# Patient Record
Sex: Male | Born: 1985 | Race: Black or African American | Hispanic: No | Marital: Single | Smoking: Current some day smoker
Health system: Southern US, Community
[De-identification: ages and names within clinical notes are randomized; demographics above are authoritative.]

## PROBLEM LIST (undated history)

## (undated) DIAGNOSIS — I10 Essential (primary) hypertension: Secondary | ICD-10-CM

## (undated) DIAGNOSIS — E785 Hyperlipidemia, unspecified: Secondary | ICD-10-CM

## (undated) HISTORY — PX: NECK SURGERY: SHX720

## (undated) HISTORY — DX: Essential (primary) hypertension: I10

## (undated) HISTORY — DX: Hyperlipidemia, unspecified: E78.5

---
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
## 2005-07-14 ENCOUNTER — Emergency Department (HOSPITAL_COMMUNITY): Admission: EM | Admit: 2005-07-14 | Discharge: 2005-07-14 | Payer: Self-pay | Admitting: *Deleted

## 2006-11-06 ENCOUNTER — Emergency Department (HOSPITAL_COMMUNITY): Admission: EM | Admit: 2006-11-06 | Discharge: 2006-11-06 | Payer: Self-pay | Admitting: Emergency Medicine

## 2007-06-08 ENCOUNTER — Emergency Department (HOSPITAL_COMMUNITY): Admission: EM | Admit: 2007-06-08 | Discharge: 2007-06-08 | Payer: Self-pay | Admitting: Emergency Medicine

## 2009-04-11 ENCOUNTER — Emergency Department: Payer: Self-pay | Admitting: Emergency Medicine

## 2011-02-25 ENCOUNTER — Emergency Department (HOSPITAL_COMMUNITY)
Admission: EM | Admit: 2011-02-25 | Discharge: 2011-02-26 | Disposition: A | Discharge status: Home / Self Care | Payer: Self-pay | Attending: Emergency Medicine | Admitting: Emergency Medicine

## 2011-02-25 DIAGNOSIS — R070 Pain in throat: Secondary | ICD-10-CM | POA: Insufficient documentation

## 2011-02-25 DIAGNOSIS — IMO0001 Reserved for inherently not codable concepts without codable children: Secondary | ICD-10-CM | POA: Insufficient documentation

## 2011-02-25 DIAGNOSIS — R05 Cough: Secondary | ICD-10-CM | POA: Insufficient documentation

## 2011-02-25 DIAGNOSIS — J3489 Other specified disorders of nose and nasal sinuses: Secondary | ICD-10-CM | POA: Insufficient documentation

## 2011-02-25 DIAGNOSIS — R6889 Other general symptoms and signs: Secondary | ICD-10-CM | POA: Insufficient documentation

## 2011-02-25 DIAGNOSIS — R059 Cough, unspecified: Secondary | ICD-10-CM | POA: Insufficient documentation

## 2011-02-25 DIAGNOSIS — R220 Localized swelling, mass and lump, head: Secondary | ICD-10-CM | POA: Insufficient documentation

## 2011-02-25 DIAGNOSIS — J069 Acute upper respiratory infection, unspecified: Secondary | ICD-10-CM | POA: Insufficient documentation

## 2015-04-26 ENCOUNTER — Emergency Department (HOSPITAL_COMMUNITY): Payer: 59

## 2015-04-26 ENCOUNTER — Encounter (HOSPITAL_COMMUNITY): Payer: Self-pay | Admitting: Emergency Medicine

## 2015-04-26 ENCOUNTER — Emergency Department (HOSPITAL_COMMUNITY)
Admission: EM | Admit: 2015-04-26 | Discharge: 2015-04-26 | Disposition: A | Discharge status: Home / Self Care | Payer: 59 | Attending: Emergency Medicine | Admitting: Emergency Medicine

## 2015-04-26 DIAGNOSIS — M25572 Pain in left ankle and joints of left foot: Secondary | ICD-10-CM | POA: Diagnosis not present

## 2015-04-26 MED ORDER — ACETAMINOPHEN 500 MG PO TABS: 500.0000 mg | ORAL_TABLET | Freq: Four times a day (QID) | ORAL | Status: DC | PRN

## 2015-04-26 MED ORDER — IBUPROFEN 800 MG PO TABS: 800.0000 mg | ORAL_TABLET | Freq: Three times a day (TID) | ORAL | Status: DC

## 2015-04-26 MED ORDER — IBUPROFEN 800 MG PO TABS
800.0000 mg | ORAL_TABLET | Freq: Once | ORAL | Status: AC
  Administered 2015-04-26: 800 mg via ORAL
  Filled 2015-04-26: qty 1

## 2015-04-26 NOTE — ED Notes (Addendum)
Per EMS, woke up around 1000 with left ankle pain. Denies any PMH. States he's been crawling around his apartment since he woke up, per EMS no deformities or injuries, only mild swelling.   Redness observed to top of right foot where patient is complaining of pain. No deformities, states he went out last night but does not remember injuring foot.

## 2015-04-26 NOTE — ED Provider Notes (Signed)
CSN: [id number]     Arrival date & time 04/26/15  1256 History  By signing my name below, I, Christopher Maynard, attest that this documentation has been prepared under the direction and in the presence of Christopher FowlerKayla Quinn Quam, PA-C.  Electronically Signed: [name], ED Scribe. 04/26/2015. 2:26 PM.    Chief Complaint  Patient presents with  . Foot Pain      Patient is a 29 y.o. male presenting with lower extremity pain. The history is provided by the patient. No language interpreter was used.  Foot Pain   HPI Comments: Margorie JohnBrian N Maynard is a 29 y.o. male who presents to the Emergency Department complaining of constant, moderate left ankle pain that has been present since this morning. Pt reports he woke up today with ankle pain after drinking alcohol last night, and reports mild swelling in the area. Pt denies any known injury, and reports it is painful to ambulate. Pt denies numbness or tingling, knee pain, leg swelling, fever. Pt denies taking any medication or doing anything to treat his pain.    History reviewed. No pertinent past medical history. History reviewed. No pertinent past surgical history. History reviewed. No pertinent family history. Social History  Substance Use Topics  . Smoking status: None  . Smokeless tobacco: None  . Alcohol Use: None    Review of Systems  A complete 10 system review of systems was obtained and all systems are negative except as noted in the HPI and PMH.    Allergies  Review of patient's allergies indicates no known allergies.  Home Medications   Prior to Admission medications   Medication Sig Start Date End Date Taking? Authorizing Provider  acetaminophen (TYLENOL) 500 MG tablet Take 1 tablet (500 mg total) by mouth every 6 (six) hours as needed. 04/26/15   Christopher FowlerKayla Nylia Gavina, PA-C  ibuprofen (ADVIL,MOTRIN) 800 MG tablet Take 1 tablet (800 mg total) by mouth 3 (three) times daily. 04/26/15   Christopher Arterberry, PA-C   BP 134/99 mmHg  Pulse 87  Temp(Src) 98.3  F (36.8 C) (Oral)  Resp 18  SpO2 99% Physical Exam  Constitutional: He is oriented to person, place, and time. He appears well-developed and well-nourished.  HENT:  Head: Normocephalic and atraumatic.  Eyes: Conjunctivae are normal. No scleral icterus.  Neck: No tracheal deviation present.  Cardiovascular: Normal rate and intact distal pulses.   Pulses:      Dorsalis pedis pulses are 2+ on the right side, and 2+ on the left side.       Posterior tibial pulses are 2+ on the right side, and 2+ on the left side.  Pulmonary/Chest: Effort normal. No respiratory distress.  Abdominal: He exhibits no distension.  Musculoskeletal: Normal range of motion. He exhibits tenderness.       Right ankle: Normal.       Left ankle: He exhibits normal range of motion, no swelling, no ecchymosis, no deformity, no laceration and normal pulse. Tenderness. Lateral malleolus, medial malleolus, AITFL and CF ligament tenderness found. No posterior TFL and no proximal fibula tenderness found. Achilles tendon normal.       Right lower leg: Normal.       Left lower leg: Normal. He exhibits no swelling.  Neurological: He is alert and oriented to person, place, and time.  Strength and sensation intact bilaterally throughout lower extremities.  Skin: Skin is warm and dry. No abrasion, no bruising, no ecchymosis, no laceration and no rash noted. No erythema.  Psychiatric: He has a  normal mood and affect. His behavior is normal.  Nursing note and vitals reviewed.   ED Course  Procedures (including critical care time)  DIAGNOSTIC STUDIES: Oxygen Saturation is 99% on room air, normal by my interpretation.    COORDINATION OF CARE: 2:14 PM Discussed treatment plan with pt at bedside and pt agreed to plan.   Labs Review Labs Reviewed - No data to display  Imaging Review Dg Ankle Complete Left  04/26/2015  CLINICAL DATA:  Pain.  No known injury EXAM: LEFT ANKLE COMPLETE - 3+ VIEW COMPARISON:  None. FINDINGS:  Frontal, oblique, and lateral views were obtained. There is no demonstrable fracture or joint effusion. The ankle mortise appears intact. There is a benign exostosis arising from the dorsal distal talus. Small unfused apophysis along the dorsal proximal navicular. IMPRESSION: Benign exostosis arising from the dorsal distal talus. No fracture or appreciable arthropathy. Mortise intact. Electronically Signed   By: Bretta Bang III M.D.   On: 04/26/2015 14:43   Dg Foot Complete Left  04/26/2015  CLINICAL DATA:  Pain.  No known trauma EXAM: LEFT FOOT - COMPLETE 3+ VIEW COMPARISON:  None. FINDINGS: Frontal, oblique, and lateral views were obtained. There is no demonstrable fracture or dislocation. Joint spaces appear intact. There is a small spur arising from the dorsal proximal navicular. No erosive change. There is a benign exostosis arising from the dorsal distal talus. IMPRESSION: No demonstrable fracture or dislocation. No appreciable joint space narrowing. Small spur arising from the dorsal proximal navicular. Small benign exostosis dorsal distal talus. Electronically Signed   By: Bretta Bang III M.D.   On: 04/26/2015 14:42   I have personally reviewed and evaluated these images and lab results as part of my medical decision-making.   EKG Interpretation None      MDM   Final diagnoses:  Left ankle pain    Patient presents with left ankle and foot pain since this morning.  No known injury.  No meds tried PTA.  VSS, NAD.  On exam, left ankle is TTP along with left foot.  No swelling noted.  Strength and sensation intact.  DP pulses intact.  No signs of trauma.  Plain films negative for fx or dislocation.  Benign exostosis arising from dorsal distal talus.  Will apply ace wrap and give motrin. Evaluation does not show pathology requring ongoing emergent intervention or admission. Pt is hemodynamically stable and mentating appropriately. Discussed findings/results and plan with  patient/guardian, who agrees with plan. All questions answered. Return precautions discussed and outpatient follow up given.    I personally performed the services described in this documentation, which was scribed in my presence. The recorded information has been reviewed and is accurate.    Christopher Fowler, PA-C 04/26/15 1510  Alvira Monday, MD 04/27/15 1212

## 2015-04-26 NOTE — Discharge Instructions (Signed)
Ankle Pain Ankle pain is a common symptom. The bones, cartilage, tendons, and muscles of the ankle joint perform a lot of work each day. The ankle joint holds your body weight and allows you to move around. Ankle pain can occur on either side or back of 1 or both ankles. Ankle pain may be sharp and burning or dull and aching. There may be tenderness, stiffness, redness, or warmth around the ankle. The pain occurs more often when a person walks or puts pressure on the ankle. CAUSES  There are many reasons ankle pain can develop. It is important to work with your caregiver to identify the cause since many conditions can impact the bones, cartilage, muscles, and tendons. Causes for ankle pain include:  Injury, including a break (fracture), sprain, or strain often due to a fall, sports, or a high-impact activity.  Swelling (inflammation) of a tendon (tendonitis).  Achilles tendon rupture.  Ankle instability after repeated sprains and strains.  Poor foot alignment.  Pressure on a nerve (tarsal tunnel syndrome).  Arthritis in the ankle or the lining of the ankle.  Crystal formation in the ankle (gout or pseudogout). DIAGNOSIS  A diagnosis is based on your medical history, your symptoms, results of your physical exam, and results of diagnostic tests. Diagnostic tests may include X-ray exams or a computerized magnetic scan (magnetic resonance imaging, MRI). TREATMENT  Treatment will depend on the cause of your ankle pain and may include:  Keeping pressure off the ankle and limiting activities.  Using crutches or other walking support (a cane or brace).  Using rest, ice, compression, and elevation.  Participating in physical therapy or home exercises.  Wearing shoe inserts or special shoes.  Losing weight.  Taking medications to reduce pain or swelling or receiving an injection.  Undergoing surgery. HOME CARE INSTRUCTIONS   Only take over-the-counter or prescription medicines for  pain, discomfort, or fever as directed by your caregiver.  Put ice on the injured area.  Put ice in a plastic bag.  Place a towel between your skin and the bag.  Leave the ice on for 15-20 minutes at a time, 03-04 times a day.  Keep your leg raised (elevated) when possible to lessen swelling.  Avoid activities that cause ankle pain.  Follow specific exercises as directed by your caregiver.  Record how often you have ankle pain, the location of the pain, and what it feels like. This information may be helpful to you and your caregiver.  Ask your caregiver about returning to work or sports and whether you should drive.  Follow up with your caregiver for further examination, therapy, or testing as directed. SEEK MEDICAL CARE IF:   Pain or swelling continues or worsens beyond 1 week.  You have an oral temperature above 102 F (38.9 C).  You are feeling unwell or have chills.  You are having an increasingly difficult time with walking.  You have loss of sensation or other new symptoms.  You have questions or concerns. MAKE SURE YOU:   Understand these instructions.  Will watch your condition.  Will get help right away if you are not doing well or get worse.   This information is not intended to replace advice given to you by your health care provider. Make sure you discuss any questions you have with your health care provider.   Document Released: 10/22/2009 Document Revised: 07/27/2011 Document Reviewed: 12/04/2014 Elsevier Interactive Patient Education 2016 [employer].  Emergency Department Resource Guide 1) Find a Doctor  and Pay Out of Pocket Although you won't have to find out who is covered by your insurance plan, it is a good idea to ask around and get recommendations. You will then need to call the office and see if the doctor you have chosen will accept you as a new patient and what types of options they offer for patients who are self-pay. Some doctors offer  discounts or will set up payment plans for their patients who do not have insurance, but you will need to ask so you aren't surprised when you get to your appointment.  2) Contact Your Local Health Department Not all health departments have doctors that can see patients for sick visits, but many do, so it is worth a call to see if yours does. If you don't know where your local health department is, you can check in your phone book. The CDC also has a tool to help you locate your state's health department, and many state websites also have listings of all of their local health departments.  3) Find a Walk-in Clinic If your illness is not likely to be very severe or complicated, you may want to try a walk in clinic. These are popping up all over the country in pharmacies, drugstores, and shopping centers. They're usually staffed by nurse practitioners or physician assistants that have been trained to treat common illnesses and complaints. They're usually fairly quick and inexpensive. However, if you have serious medical issues or chronic medical problems, these are probably not your best option.  No Primary Care Doctor: - Call Health Connect at  [phone] - they can help you locate a primary care doctor that  accepts your insurance, provides certain services, etc. - Physician Referral Service- [phone]  Chronic Pain Problems: Organization         Address  Phone   Notes  [facility]  [phone] Patients need to be referred by their primary care doctor.   Medication Assistance: Organization         Address  Phone   Notes  [facility] Medication [facility] [address] [place] [place], [place] [address] [phone] --Must be a resident of [facility] -- Must have NO insurance coverage whatsoever (no Medicaid/ Medicare, etc.) -- The pt. MUST have a primary care doctor that directs their care regularly and follows them in the community   [facility]ist   [phone]   [employer]  [phone]    Agencies that provide inexpensive medical care: Organization         Address  Phone   Notes  [facility]  [phone]   [facility]    [phone]   [facility] [address] [place], [place] [address] [phone]   [facility] [address] [place], [place] [phone]   Planned Parenthood    [phone]   [facility]    [phone]   [facility]  [address], [place] Phone:  [phone], Fax:  [phone] Hours of Operation:  9 am - 6 pm, M-F.  Also accepts Medicaid/Medicare and self-pay.  [facility]  [address], [place] Phone: [phone], Fax: [phone]. Hours of Operation:  8:30 am - 5:30 pm, M-F.  Also accepts Medicaid and self-pay.  [facility] [place] [address] [employer] Phone: [phone]  [facility] [address], [place], [place] [phone], Ext. [phone] Mondays & Thursdays: 7-9 AM.  First 15 patients are seen on a first come, first serve basis.    Medicaid-accepting [facility] Providers:  Organization         Address  Phone   Notes  [facility] [address], [place] [phone] Also accepts self-pay patients.  [facility] [address], [place]  [phone]) [phone]   [facility] [address], [place] [phone]   [facility] [address] [place] [address], [place] [phone]   Renaye RakersVeita Bland [address], [place]   [phone] Only accepts [place] Access [place]icaid patients after they have their name applied to their card.   Self-Pay (no insurance) in [facility] [place]:  Organization         Address  Phone   Notes  Sickle Cell Patients, [facility] [address], [place] [phone]   [facility] [address], [place] [phone]   [facility]  [address],  [phone]) [phone]   [facility]/Dr. Osei-Bonsu  [address] [place] [address], [place] or [phone], [place] [phone] Phone number for both [place] and [place] locations is the same.  [facility] [address], [place] [phone]   [facility] [place] [address] [place] [address], [place] or [address] [phone]) [phone]) [phone]   [facility] [address], [place] [phone], phone; [phone], fax Sees patients 1st and 3rd Saturday of every month.  Must not qualify for public or private insurance (i.e. Medicaid, Medicare, [place] Health Choice, Veterans' Benefits)  Household income should be no more than 200% of the poverty level The clinic cannot treat you if you are pregnant or think you are pregnant  Sexually transmitted diseases are not treated at the clinic.    Dental Care: Organization         Address  Phone  Notes  [facility] of [facility] [address], [place] [phone] Accepts children up to age 621 who are enrolled in [place]icaid or [place] Health Choice; pregnant women with a Medicaid card; and children who have applied for Medicaid or [place] Health Choice, but were declined, whose parents can pay a reduced fee at time of service.  [facility] of [facility] [place]  [address], [place] [phone]) [phone] Accepts children up to age 29 who are enrolled in [place] or [place] Health Choice; pregnant women with a Medicaid card; and children who have applied for Medicaid or [place] Health Choice, but were declined, whose parents can pay a reduced fee at time of service.  [facility]  [address], [place]  [phone] Patients are seen by appointment only. Walk-ins are not accepted. [facility] will see patients 29 years of age and older. Monday - Tuesday (8am-5pm) Most Wednesdays (8:30-5pm) $30 per visit, cash only  [facility]  [address], [facility] [phone] Patients are seen by appointment only. Walk-ins are not accepted. [facility] will see patients 29 years of age and older. One Wednesday Evening (Monthly: Volunteer Based).  $30 per visit,  cash only  [employer] [facility]  [phone] for adults; Children under age 864, call Graduate Pediatric Dentistry at [phone]. Children aged 404-14, please call [phone]) [phone] to request a pediatric application.  Dental services are provided in all areas of dental care including fillings, crowns and bridges, complete and partial dentures, implants, gum treatment, root canals, and extractions. Preventive care is also provided. Treatment is provided to both adults and children. Patients are selected via a lottery and there is often a waiting list.   [facility] [address], [place]  [phone] [email or web address]   [facility] [address], [place], [place] [phone], Ext. [phone] Second and Fourth Thursday of each month, opens at 6:30 AM; Clinic ends at 9 AM.  Patients are seen on a first-come first-served basis, and a limited number are seen during each clinic.   [facility]  [address], [place], [place] [phone]   Eligibility Requirements You must have lived in [place], [place], or [place] counties for at least the last three months.   You cannot be eligible for state or federal sponsored National Cityhealthcare insurance, including CIGNAVeterans Administration, [place]icaid, or [employer]icare.   You generally cannot be eligible for healthcare insurance through your employer.    How to apply: Eligibility screenings are held every Tuesday and Wednesday  afternoon from 1:00 pm until 4:00 pm. You do not need an appointment for the interview!  [facility] [address], [place], [place] [phone]   [facility]  [phone]   [facility]  [phone]   [facility]  [phone]    Behavioral Health Resources in the Community: Intensive Outpatient Programs Organization         Address  Phone  Notes  [facility] [address], [place], [place] [phone]   [facility] [address], [place], [place] [phone]   [facility] [address], [place], [place]  [phone]   [facility] [address],  [place], [place] [phone] or [phone]   Substance Abuse Resources Organization         Address  Phone  Notes  [facility]  [phone]   [facility]  [phone]   [facility]  [phone]   [facility]  [phone]   Residential & Outpatient Substance Abuse Program  [phone]   Psychological Services Organization         Address  Phone  Notes  [facility]  [phone]- [phone]   [facility]  [phone]- [phone]   [facility] [address], [place] [phone] or [phone]    Mobile Crisis Teams Organization         Address  Phone  Notes  [facility], Mobile Crisis Care Unit  [phone]   [facility]  [address] [place], [place] [phone]   Doristine LocksSharon DeEsch [address] [place] [phone]    Self-Help/Support Groups Organization         Address  Phone             Notes  [facility] [place] - variety of support groups  336- [phone] Call for more information  Narcotics Anonymous (NA), [facility] [address], [place]  2 meetings at this location   Nutritional therapistesidential Treatment  Programs Organization  Address  Phone  Notes  °[facility] [address],    °[place]  [phone]   °[facility] ° [address], [place], [place] [phone]   °[facility] [address], [place] [phone] Admissions: 8am-3pm M-F  °[facility] [address]    °[place], [place] [phone]   °[facility] [address], [place], [place] [phone]   °[facility] [address]  °[place], [place] [phone]   °[facility] - Intensive Outpatient [address], [place], [place] [phone]   °[facility] ([facility] [address]  °[place], [place] 1-877-615-2722 or [phone]   °[facility] ([facility]) [address] [place], [place] [phone] Accepts Medicaid  °[facility] [address]  °[place] 1-800-659-3381 Substance Abuse/Addiction Treatment  ° °[place] Behavioral Health Resources °Organization         Address  Phone  Notes  °[facility]  (888) [phone]   °Julie Brannon, PhD [address] [place], [place]   ([phone]) [phone] or (336) 951-0000   °[facility]   [address] °[place], [place] (336) [phone]   °[facility] [address], [place], [place] (336) [phone] Insurance/Medicaid/sponsorship through [facility]  °[facility] [address]                                    [place], [place] ([phone]) [phone] Therapy/tele-psych/case  °[facility] [address]  ° [place],  ([phone]) [phone]    °Dr. Arfeen  ([phone]) [phone]   °[facility] [place]  [employer] [place] [facility] 1) [address], [place] °2) [address], [place] °3)  [address], [place] (336) [phone] °(336) [phone] ° °(336) [phone]   °[place] Child Abuse Hotline (336) 342-1394 or (336) [phone] (After Hours)    ° ° ° °

## 2017-01-31 ENCOUNTER — Emergency Department (HOSPITAL_COMMUNITY): Payer: 59

## 2017-01-31 ENCOUNTER — Encounter (HOSPITAL_COMMUNITY): Payer: Self-pay | Admitting: Emergency Medicine

## 2017-01-31 ENCOUNTER — Emergency Department (HOSPITAL_COMMUNITY)
Admission: EM | Admit: 2017-01-31 | Discharge: 2017-01-31 | Disposition: A | Discharge status: Home / Self Care | Payer: 59 | Attending: Emergency Medicine | Admitting: Emergency Medicine

## 2017-01-31 DIAGNOSIS — M25442 Effusion, left hand: Secondary | ICD-10-CM | POA: Insufficient documentation

## 2017-01-31 DIAGNOSIS — F17200 Nicotine dependence, unspecified, uncomplicated: Secondary | ICD-10-CM | POA: Insufficient documentation

## 2017-01-31 MED ORDER — MELOXICAM 15 MG PO TABS: 15.0000 mg | ORAL_TABLET | Freq: Every day | ORAL | 0 refills | Status: DC

## 2017-01-31 MED ORDER — NAPROXEN 500 MG PO TABS
500.0000 mg | ORAL_TABLET | Freq: Once | ORAL | Status: AC
  Administered 2017-01-31: 500 mg via ORAL
  Filled 2017-01-31: qty 1

## 2017-01-31 MED ORDER — OXYCODONE-ACETAMINOPHEN 5-325 MG PO TABS
1.0000 | ORAL_TABLET | ORAL | Status: DC | PRN
  Administered 2017-01-31: 1 via ORAL
  Filled 2017-01-31: qty 1

## 2017-01-31 NOTE — ED Triage Notes (Signed)
Patient states that two weeks ago his finger started swelling. His Left finger has had no injury to it.

## 2017-01-31 NOTE — ED Provider Notes (Addendum)
[facility]-EMERGENCY DEPT Provider Note   CSN: [id number] Arrival date & time: 01/31/17  0010     History   Chief Complaint Chief Complaint  Patient presents with  . Joint Swelling    HPI [facility] is a 31 y.o. male.  The history is provided by the patient.  Hand Pain  This is a chronic problem. The current episode started more than 1 week ago (at least 2 weeks ). The problem occurs constantly. The problem has not changed since onset.Pertinent negatives include no chest pain, no abdominal pain, no headaches and no shortness of breath. Nothing aggravates the symptoms. Nothing relieves the symptoms. He has tried nothing for the symptoms. The treatment provided no relief.  Left middle finger with PIP swelling.  No warmth, or erythema. Denies trauma.  Was reportedly seen by outside urgent care and told it was likely arthritis and prescribed meloxicam which he did not take.    History reviewed. No pertinent past medical history.  There are no active problems to display for this patient.   History reviewed. No pertinent surgical history.     Home Medications    Prior to Admission medications   Medication Sig Start Date End Date Taking? Authorizing Provider  acetaminophen (TYLENOL) 500 MG tablet Take 1 tablet (500 mg total) by mouth every 6 (six) hours as needed. Patient taking differently: Take 500 mg by mouth every 6 (six) hours as needed for mild pain or moderate pain.  04/26/15  Yes Cheri Fowler, PA-C  ibuprofen (ADVIL,MOTRIN) 800 MG tablet Take 1 tablet (800 mg total) by mouth 3 (three) times daily. Patient not taking: Reported on 01/31/2017 04/26/15   Cheri Fowler, PA-C    Family History History reviewed. No pertinent family history.  Social History Social History  Substance Use Topics  . Smoking status: Current Every Day Smoker  . Smokeless tobacco: Never Used  . Alcohol use Yes     Allergies   Patient has no known allergies.   Review of Systems Review of  Systems  Constitutional: Negative for fever.  Respiratory: Negative for shortness of breath.   Cardiovascular: Negative for chest pain.  Gastrointestinal: Negative for abdominal pain.  Musculoskeletal: Positive for arthralgias and joint swelling.  Neurological: Negative for weakness, numbness and headaches.  All other systems reviewed and are negative.    Physical Exam Updated Vital Signs BP (!) 144/94 (BP Location: Left Arm)   Pulse 89   Temp 98.4 F (36.9 C) (Oral)   Resp 15   Ht  (1.727 m)   Wt 61.2 kg (135 lb)   SpO2 93%   BMI 20.53 kg/m   Physical Exam  Constitutional: He is oriented to person, place, and time. He appears well-developed and well-nourished.  HENT:  Head: Normocephalic and atraumatic.  Eyes: Pupils are equal, round, and reactive to light. Conjunctivae are normal.  Neck: Normal range of motion. Neck supple.  Cardiovascular: Normal rate, regular rhythm, normal heart sounds and intact distal pulses.   Pulmonary/Chest: Effort normal and breath sounds normal. He has no wheezes.  Abdominal: Soft. Bowel sounds are normal. He exhibits no mass. There is no tenderness. There is no rebound and no guarding.  Musculoskeletal:       Left wrist: Normal.       Left forearm: Normal.       Left hand: He exhibits swelling. He exhibits normal range of motion, no tenderness, no bony tenderness, normal two-point discrimination, normal capillary refill, no deformity and no laceration.  Normal sensation noted. Normal strength noted. He exhibits no finger abduction and no thumb/finger opposition.       Hands: No kanavel signs of the left middle finger, finger and hand are neurovascularly intact  Neurological: He is alert and oriented to person, place, and time. He displays normal reflexes.  Skin: Skin is warm and dry. Capillary refill takes less than 2 seconds.  Psychiatric: He has a normal mood and affect.     ED Treatments / Results   Radiology Dg Hand Complete  Left  Result Date: 01/31/2017 CLINICAL DATA:  Middle finger swelling for 2 weeks. No known injury. EXAM: LEFT HAND - COMPLETE 3+ VIEW COMPARISON:  None. FINDINGS: There is no evidence of fracture or dislocation. There is no evidence of arthropathy or other focal bone abnormality. Soft tissue edema of the third digit centered at the proximal interphalangeal joint. No soft tissue air. No radiopaque foreign body. IMPRESSION: Soft tissue edema of the third digit centered at the proximal interphalangeal joint. No acute osseous abnormality. Electronically Signed   By: Rubye Oaks M.D.   On: 01/31/2017 02:23    Procedures Procedures (including critical care time)  Medications Ordered in ED Medications  oxyCODONE-acetaminophen (PERCOCET/ROXICET) 5-325 MG per tablet 1 tablet (1 tablet Oral Given 01/31/17 0230)  naproxen (NAPROSYN) tablet 500 mg (not administered)       Final Clinical Impressions(s) / ED Diagnoses  There is swelling of the left middle PIP.  It has been going on for some time and it is unclear what originally provoked the swelling.  The finger is neither broken or dislocated and the tendon function and capillary refill are intact.  The patient has not taken the prescribed treatment. This is almost certainly why it has not improved.  He has been advised to ice the finger for 20 minutes every 2 hours and elevated the hand (finger) when not in use and wear the finger splint until seen by hand surgery in follow up.  He will need to call Dr. Orlan Leavens of hand surgery on Monday to schedule a follow up appointment.  He can wear the splint to protect the finger while at work.  He verbalizes understanding of all verbal instructions.      Strict return precautions given for  chest pain, dyspnea on exertion, new weakness or numbness changes in vision or speech,  Inability to tolerate liquids or food, changes in voice cough, altered mental status or any concerns. No signs of systemic illness or  infection. The patient is nontoxic-appearing on exam and vital signs are within normal limits.    I have reviewed the triage vital signs and the nursing notes. Pertinent labs &imaging results that were available during my care of the patient were reviewed by me and considered in my medical decision making (see chart for details).  After history, exam, and medical workup I feel the patient has been appropriately medically screened and is safe for discharge home. Pertinent diagnoses were discussed with the patient. Patient was given return precautions.        Mirren Gest, MD 01/31/17 [id number]    Cy Blamer, MD 01/31/17 [id number]

## 2017-01-31 NOTE — ED Notes (Signed)
Patient transported to X-ray 

## 2017-01-31 NOTE — ED Notes (Signed)
Bed: WA05 Expected date:  Expected time:  Means of arrival:  Comments: 

## 2017-08-26 ENCOUNTER — Encounter (HOSPITAL_COMMUNITY): Payer: Self-pay

## 2017-08-26 ENCOUNTER — Emergency Department (HOSPITAL_COMMUNITY)
Admission: EM | Admit: 2017-08-26 | Discharge: 2017-08-26 | Disposition: A | Discharge status: Home / Self Care | Payer: 59 | Attending: Emergency Medicine | Admitting: Emergency Medicine

## 2017-08-26 ENCOUNTER — Emergency Department (HOSPITAL_COMMUNITY): Payer: 59

## 2017-08-26 DIAGNOSIS — F17200 Nicotine dependence, unspecified, uncomplicated: Secondary | ICD-10-CM | POA: Insufficient documentation

## 2017-08-26 DIAGNOSIS — R69 Illness, unspecified: Secondary | ICD-10-CM

## 2017-08-26 DIAGNOSIS — R5383 Other fatigue: Secondary | ICD-10-CM | POA: Diagnosis present

## 2017-08-26 DIAGNOSIS — J111 Influenza due to unidentified influenza virus with other respiratory manifestations: Secondary | ICD-10-CM | POA: Insufficient documentation

## 2017-08-26 MED ORDER — KETOROLAC TROMETHAMINE 60 MG/2ML IM SOLN
60.0000 mg | Freq: Once | INTRAMUSCULAR | Status: AC
  Administered 2017-08-26: 60 mg via INTRAMUSCULAR
  Filled 2017-08-26: qty 2

## 2017-08-26 MED ORDER — ACETAMINOPHEN 500 MG PO TABS
1000.0000 mg | ORAL_TABLET | Freq: Once | ORAL | Status: AC
  Administered 2017-08-26: 1000 mg via ORAL
  Filled 2017-08-26: qty 2

## 2017-08-26 NOTE — ED Notes (Signed)
Pt unable to sign for d/c due to system down time.

## 2017-08-26 NOTE — ED Provider Notes (Signed)
[facility]-EMERGENCY DEPT Provider Note   CSN: [id number] Arrival date & time: 08/26/17  0021     History   Chief Complaint Chief Complaint  Patient presents with  . Chills  . Fatigue    HPI [facility] is a 32 y.o. male.  HPI 32 year old male here with generalized body aches and fatigue.  The patient states that his symptoms started yesterday.  He states that he noticed diffuse body aches, chills, and subjective fever.  He had associated mild cough and nasal congestion.  Patient had no associated abdominal pain, nausea, or vomiting.  He has since had intermittent chills.  He feels "out of it" but denies any focal numbness or weakness.  No headache or neck stiffness.  No photophobia.  He does have sick contacts.  Denies any pain currently.  Denies any alleviating or aggravating factors.  He is not taking any meds today.  History reviewed. No pertinent past medical history.  There are no active problems to display for this patient.   History reviewed. No pertinent surgical history.      Home Medications    Prior to Admission medications   Medication Sig Start Date End Date Taking? Authorizing Provider  acetaminophen (TYLENOL) 500 MG tablet Take 1 tablet (500 mg total) by mouth every 6 (six) hours as needed. Patient not taking: Reported on 08/26/2017 04/26/15   Cheri Fowlerose, Kayla, PA-C  ibuprofen (ADVIL,MOTRIN) 800 MG tablet Take 1 tablet (800 mg total) by mouth 3 (three) times daily. Patient not taking: Reported on 01/31/2017 04/26/15   Cheri Fowlerose, Kayla, PA-C  meloxicam (MOBIC) 15 MG tablet Take 1 tablet (15 mg total) by mouth daily. Patient not taking: Reported on 08/26/2017 01/31/17   Nicanor AlconPalumbo, April, MD    Family History History reviewed. No pertinent family history.  Social History Social History   Tobacco Use  . Smoking status: Current Every Day Smoker  . Smokeless tobacco: Never Used  Substance Use Topics  . Alcohol use: Yes  . Drug use: No      Allergies   Patient has no known allergies.   Review of Systems Review of Systems  Constitutional: Positive for chills, fatigue and fever.  HENT: Positive for congestion, rhinorrhea and sore throat.   Eyes: Negative for visual disturbance.  Respiratory: Positive for cough. Negative for shortness of breath and wheezing.   Cardiovascular: Negative for chest pain and leg swelling.  Gastrointestinal: Negative for abdominal pain, diarrhea, nausea and vomiting.  Genitourinary: Negative for dysuria and flank pain.  Musculoskeletal: Positive for arthralgias. Negative for neck pain and neck stiffness.  Skin: Negative for rash and wound.  Allergic/Immunologic: Negative for immunocompromised state.  Neurological: Negative for syncope, weakness and headaches.  All other systems reviewed and are negative.    Physical Exam Updated Vital Signs BP (!) 142/101   Pulse 84   Temp 98 F (36.7 C) (Oral)   Resp 18   Ht 5\' 8"  (1.727 m)   Wt 63.5 kg (140 lb)   SpO2 100%   BMI 21.29 kg/m   Physical Exam  Constitutional: He is oriented to person, place, and time. He appears well-developed and well-nourished. No distress.  HENT:  Head: Normocephalic and atraumatic.  Mild posterior pharyngeal erythema without tonsillar swelling or exudates.  Eyes: Conjunctivae are normal.  Neck: Neck supple.  Cardiovascular: Normal rate, regular rhythm and normal heart sounds. Exam reveals no friction rub.  No murmur heard. Pulmonary/Chest: Effort normal and breath sounds normal. No respiratory distress. He  has no wheezes. He has no rales.  Abdominal: He exhibits no distension.  Musculoskeletal: He exhibits no edema.  Neurological: He is alert and oriented to person, place, and time. He exhibits normal muscle tone.  Skin: Skin is warm. Capillary refill takes less than 2 seconds.  Psychiatric: He has a normal mood and affect.  Nursing note and vitals reviewed.    ED Treatments / Results  Labs (all  labs ordered are listed, but only abnormal results are displayed) Labs Reviewed - No data to display  EKG None  Radiology Dg Chest 2 View  Result Date: 08/26/2017 CLINICAL DATA:  Chills and cough. EXAM: CHEST - 2 VIEW COMPARISON:  None. FINDINGS: Low lung volumes.The cardiomediastinal contours are normal. The lungs are clear. Pulmonary vasculature is normal. No consolidation, pleural effusion, or pneumothorax. No acute osseous abnormalities are seen. Severe S-shaped scoliotic curvature of the spine. IMPRESSION: Low lung volumes without acute abnormality. Severe scoliosis. Electronically Signed   By: Rubye Oaks M.D.   On: 08/26/2017 02:28    Procedures Procedures (including critical care time)  Medications Ordered in ED Medications  acetaminophen (TYLENOL) tablet 1,000 mg (1,000 mg Oral Given 08/26/17 0228)  ketorolac (TORADOL) injection 60 mg (60 mg Intramuscular Given 08/26/17 0228)     Initial Impression / Assessment and Plan / ED Course  I have reviewed the triage vital signs and the nursing notes.  Pertinent labs & imaging results that were available during my care of the patient were reviewed by me and considered in my medical decision making (see chart for details).     Very well-appearing 32 year old male here with generalized body aches, fatigue, arthralgias, and subjective fevers.  Concern for influenza or influenza-like illness.  Chest x-ray is clear.  He is afebrile and hemodynamically stable.  He is satting well on room air.  Abdomen is soft and nontender with no signs of intra-abdominal pathology.  Will treat for possible influenza with empiric Tamiflu and supportive care.  Encourage fluids.  Discharge home.  INITIAL DOCUMENTATION DELAYED DUE TO EPIC DOWNTIME   Final Clinical Impressions(s) / ED Diagnoses   Final diagnoses:  Influenza-like illness    ED Discharge Orders    None       Shaune Pollack, MD 08/26/17 [phone]

## 2017-08-26 NOTE — ED Triage Notes (Signed)
Pt presents with c/o general feeling of fatigue and feeling confused. Pt is able to answer all questions appropriately, no neuro deficits noted. Pt also c/o chills all over.

## 2018-01-22 ENCOUNTER — Encounter (HOSPITAL_COMMUNITY): Payer: Self-pay | Admitting: *Deleted

## 2018-01-22 ENCOUNTER — Other Ambulatory Visit: Payer: Self-pay

## 2018-01-22 DIAGNOSIS — S4992XA Unspecified injury of left shoulder and upper arm, initial encounter: Secondary | ICD-10-CM | POA: Diagnosis present

## 2018-01-22 DIAGNOSIS — W010XXA Fall on same level from slipping, tripping and stumbling without subsequent striking against object, initial encounter: Secondary | ICD-10-CM | POA: Insufficient documentation

## 2018-01-22 DIAGNOSIS — S42032A Displaced fracture of lateral end of left clavicle, initial encounter for closed fracture: Secondary | ICD-10-CM | POA: Diagnosis not present

## 2018-01-22 DIAGNOSIS — F17200 Nicotine dependence, unspecified, uncomplicated: Secondary | ICD-10-CM | POA: Insufficient documentation

## 2018-01-22 DIAGNOSIS — Y999 Unspecified external cause status: Secondary | ICD-10-CM | POA: Diagnosis not present

## 2018-01-22 DIAGNOSIS — Z79899 Other long term (current) drug therapy: Secondary | ICD-10-CM | POA: Insufficient documentation

## 2018-01-22 DIAGNOSIS — Y929 Unspecified place or not applicable: Secondary | ICD-10-CM | POA: Insufficient documentation

## 2018-01-22 DIAGNOSIS — Y93K1 Activity, walking an animal: Secondary | ICD-10-CM | POA: Diagnosis not present

## 2018-01-22 NOTE — ED Triage Notes (Signed)
Pt arrives ambulatory to triage. He says he was walking his dog on a leash and the dog tried to run off, he fell down onto his left arm. Abrasion to the left hand and left shoulder. Pain in the left shoulder. No meds PTA.

## 2018-01-23 ENCOUNTER — Emergency Department (HOSPITAL_COMMUNITY): Payer: 59

## 2018-01-23 ENCOUNTER — Emergency Department (HOSPITAL_COMMUNITY)
Admission: EM | Admit: 2018-01-23 | Discharge: 2018-01-23 | Disposition: A | Discharge status: Home / Self Care | Payer: 59 | Attending: Emergency Medicine | Admitting: Emergency Medicine

## 2018-01-23 DIAGNOSIS — S42032A Displaced fracture of lateral end of left clavicle, initial encounter for closed fracture: Secondary | ICD-10-CM

## 2018-01-23 MED ORDER — HYDROCODONE-ACETAMINOPHEN 5-325 MG PO TABS: 1.0000 | ORAL_TABLET | Freq: Three times a day (TID) | ORAL | 0 refills | Status: DC | PRN

## 2018-01-23 MED ORDER — HYDROCODONE-ACETAMINOPHEN 5-325 MG PO TABS: 1.0000 | ORAL_TABLET | Freq: Three times a day (TID) | ORAL | 0 refills | Status: AC | PRN

## 2018-01-23 MED ORDER — OXYCODONE-ACETAMINOPHEN 5-325 MG PO TABS
1.0000 | ORAL_TABLET | Freq: Once | ORAL | Status: AC
  Administered 2018-01-23: 1 via ORAL
  Filled 2018-01-23: qty 1

## 2018-01-23 MED ORDER — HYDROCODONE-ACETAMINOPHEN 5-325 MG PO TABS
1.0000 | ORAL_TABLET | Freq: Once | ORAL | Status: AC
  Administered 2018-01-23: 1 via ORAL
  Filled 2018-01-23: qty 1

## 2018-01-23 NOTE — ED Provider Notes (Signed)
[facility]-EMERGENCY DEPT Provider Note   CSN: [id number] Arrival date & time: 01/22/18  2239     History   Chief Complaint Chief Complaint  Patient presents with  . Shoulder Pain    HPI [facility] Maynard is a 32 y.o. male.  HPI 32 year old male comes in with chief complaint of shoulder pain. Patient has no significant medical history.  He is left-handed and reports that his dog got spooked and suddenly jumped up to chase another dog and patient was pulled suddenly and led him to a fall.  Patient is having severe pain around his shoulder.  He denies any numbness, tingling.  Patient denies any headaches or head trauma, neck pain or any neurologic symptoms.  History reviewed. No pertinent past medical history.  There are no active problems to display for this patient.   History reviewed. No pertinent surgical history.      Home Medications    Prior to Admission medications   Medication Sig Start Date End Date Taking? Authorizing Provider  acetaminophen (TYLENOL) 500 MG tablet Take 1 tablet (500 mg total) by mouth every 6 (six) hours as needed. Patient not taking: Reported on 08/26/2017 04/26/15   Cheri Fowler, PA-C  ibuprofen (ADVIL,MOTRIN) 800 MG tablet Take 1 tablet (800 mg total) by mouth 3 (three) times daily. Patient not taking: Reported on 01/31/2017 04/26/15   Cheri Fowler, PA-C  meloxicam (MOBIC) 15 MG tablet Take 1 tablet (15 mg total) by mouth daily. Patient not taking: Reported on 08/26/2017 01/31/17   Nicanor Alcon, April, MD    Family History No family history on file.  Social History Social History   Tobacco Use  . Smoking status: Current Every Day Smoker  . Smokeless tobacco: Never Used  Substance Use Topics  . Alcohol use: Yes  . Drug use: No     Allergies   Patient has no known allergies.   Review of Systems Review of Systems  Constitutional: Positive for activity change.  Musculoskeletal: Positive for arthralgias and myalgias.    Skin: Positive for wound.  Neurological: Negative for numbness.     Physical Exam Updated Vital Signs BP (!) 156/114   Pulse (!) 101   Temp 98.9 F (37.2 C) (Oral)   Resp (!) 25   Ht 5\' 8"  (1.727 m)   Wt 63.5 kg   SpO2 100%   BMI 21.29 kg/m   Physical Exam  Constitutional: He is oriented to person, place, and time. He appears well-developed.  HENT:  Head: Atraumatic.  Neck: Neck supple.  Cardiovascular: Normal rate and intact distal pulses.  Pulmonary/Chest: Effort normal.  Musculoskeletal: He exhibits edema, tenderness and deformity.  Patient has significant edema over the left shoulder region.  He has tenderness to palpation over the distal clavicular region. Patient also has abrasions over his hand without any deformity or focal tenderness.  Patient has intact distal pulse and is neurovascularly intact over the left upper extremity.  Neurological: He is alert and oriented to person, place, and time.  Skin: Skin is warm.  Nursing note and vitals reviewed.    ED Treatments / Results  Labs (all labs ordered are listed, but only abnormal results are displayed) Labs Reviewed - No data to display  EKG None  Radiology Dg Shoulder Left  Result Date: 01/23/2018 CLINICAL DATA:  Left shoulder pain after fall EXAM: LEFT SHOULDER - 2+ VIEW COMPARISON:  None. FINDINGS: Acute, closed distal clavicular fracture without intra-articular extension into the acromioclavicular joint. Slight caudal displacement  of the distal fracture fragment is seen on the scapular Y-view. The proximal humerus and glenohumeral joint is intact. IMPRESSION: Acute fracture of the distal left clavicle with slight caudal displacement of the distal fracture fragment Electronically Signed   By: Tollie Eth M.D.   On: 01/23/2018 00:19    Procedures Procedures (including critical care time)  Medications Ordered in ED Medications  oxyCODONE-acetaminophen (PERCOCET/ROXICET) 5-325 MG per tablet 1 tablet (has  no administration in time range)  HYDROcodone-acetaminophen (NORCO/VICODIN) 5-325 MG per tablet 1 tablet (1 tablet Oral Given 01/23/18 0205)     Initial Impression / Assessment and Plan / ED Course  I have reviewed the triage vital signs and the nursing notes.  Pertinent labs & imaging results that were available during my care of the patient were reviewed by me and considered in my medical decision making (see chart for details).     32 year old male comes in with chief complaint of left-sided shoulder pain. He had a FOOSH type injury earlier today resulting in left-sided distal clavicular fracture.  Patient is neurovascularly intact.  We will put him in a collar and have him follow-up with orthopedist.   Final Clinical Impressions(s) / ED Diagnoses   Final diagnoses:  Closed displaced fracture of acromial end of left clavicle, initial encounter    ED Discharge Orders    None       Derwood Kaplan, MD 01/23/18 [phone]

## 2018-02-08 ENCOUNTER — Ambulatory Visit (INDEPENDENT_AMBULATORY_CARE_PROVIDER_SITE_OTHER): Payer: 59 | Admitting: Orthopaedic Surgery

## 2018-02-08 ENCOUNTER — Encounter (INDEPENDENT_AMBULATORY_CARE_PROVIDER_SITE_OTHER): Payer: Self-pay | Admitting: Orthopaedic Surgery

## 2018-02-08 ENCOUNTER — Ambulatory Visit (INDEPENDENT_AMBULATORY_CARE_PROVIDER_SITE_OTHER): Payer: Self-pay

## 2018-02-08 VITALS — BP 136/99 | HR 100 | Ht 68.0 in | Wt 145.0 lb

## 2018-02-08 DIAGNOSIS — M25512 Pain in left shoulder: Secondary | ICD-10-CM | POA: Diagnosis not present

## 2018-02-08 DIAGNOSIS — S42032A Displaced fracture of lateral end of left clavicle, initial encounter for closed fracture: Secondary | ICD-10-CM

## 2018-02-08 DIAGNOSIS — S42032D Displaced fracture of lateral end of left clavicle, subsequent encounter for fracture with routine healing: Secondary | ICD-10-CM

## 2018-02-08 HISTORY — DX: Displaced fracture of lateral end of left clavicle, initial encounter for closed fracture: S42.032A

## 2018-02-08 MED ORDER — TRAMADOL HCL 50 MG PO TABS: 50.0000 mg | ORAL_TABLET | Freq: Four times a day (QID) | ORAL | 0 refills | Status: DC | PRN

## 2018-02-08 NOTE — Addendum Note (Signed)
Addended by: Rogers SeedsYEATTS, Luz Burcher M on: 02/08/2018 03:56 PM   Modules accepted: Orders

## 2018-02-08 NOTE — Progress Notes (Addendum)
Office Visit Note   Patient: Christopher Maynard           Date of Birth: Oct 26, 1985           MRN: [id number] Visit Date: 02/08/2018              Requested by: No referring provider defined for this encounter. PCP: [facility]   Assessment & Plan: Visit Diagnoses:  1. Acute pain of left shoulder   2. Closed traumatic minimally displaced fracture of acromial end of left clavicle with routine healing, subsequent encounter   2. scoliosis 3. Distal clavicle fx.  Plan: We discussed and reviewed x-rays obtained today.  Fracture is in satisfactory position.  Conservative treatment recommended.  We will check him back again in 3 weeks.  Work slip given for light duty work x3 weeks.  Repeat x-ray on return left clavicle.ultram number 20 tablets prescribed that he can use at night to help with sleep.  We discussed he will be more comfortable sleeping in a beachchair position for a few weeks. OV .   Follow-Up Instructions: Return in about 3 weeks (around 03/01/2018).   Orders:  Orders Placed This Encounter  Procedures  . XR Clavicle Left   Meds ordered this encounter  Medications  . traMADol (ULTRAM) 50 MG tablet    Sig: Take 1 tablet (50 mg total) by mouth every 6 (six) hours as needed.    Dispense:  20 tablet    Refill:  0      Procedures: No procedures performed   Clinical Data: No additional findings.   Subjective: Chief Complaint  Patient presents with  . Left Shoulder - Pain    DOI 01/23/18  Left clavicle fx    HPI 32 year old male was walking his dog his dog suddenly jerked and pulled on the least to chase another dog causing him to fall landing on his left shoulder.  He is left-hand dominant.  Patient had some hydrocodone supplied the emergency room.  He works at [employer] and is currently being trained for another job.  Currently he is on light duty.  He denies any other injuries with the fall no past history of injury to it.  He does have history of  significant scoliosis with a right thoracic, left lumbar curve greater than 60 degrees.  He states that he has to stand for long time he has some low back pain.  Review of Systems scoliosis he was seen by a provider when he was a child has not been followed since that time.  He is been active like play some basketball when he was younger he has not had any surgeries no significant hospitalizations.   Objective: Vital Signs: BP (!) 136/99   Pulse 100   Ht 5\' 8"  (1.727 m)   Wt 145 lb (65.8 kg)   BMI 22.05 kg/m   Physical Exam  Constitutional: He is oriented to person, place, and time. He appears well-developed and well-nourished.  HENT:  Head: Normocephalic and atraumatic.  Eyes: Pupils are equal, round, and reactive to light. EOM are normal.  Neck: No tracheal deviation present. No thyromegaly present.  Cardiovascular: Normal rate.  Pulmonary/Chest: Effort normal. He has no wheezes.  Abdominal: Soft. Bowel sounds are normal.  Neurological: He is alert and oriented to person, place, and time.  Skin: Skin is warm and dry. Capillary refill takes less than 2 seconds.  Psychiatric: He has a normal mood and affect. His behavior is  normal. Judgment and thought content normal.    Ortho Exam patient has some prominence of the distal clavicle with abrasion over the top of the clavicle this is a closed fracture.  Sensation is hand is normal there is no swelling he has good grip strength.  Specialty Comments:  No specialty comments available.  Imaging: view x-rays left clavicle obtained and reviewed.  This shows distal clavicle fracture 1 cm from the acromioclavicular joint with some periosteal callus formation.  Minimal displacement.  Impression: Distal clavicle fracture minimal displacement with some interval healing comparison to 01/23/2018 images.     PMFS History: Patient Active Problem List   Diagnosis Date Noted  . Traumatic closed fracture of acromial end of left clavicle with  minimal displacement 02/08/2018   History reviewed. No pertinent past medical history.  History reviewed. No pertinent family history.  History reviewed. No pertinent surgical history. Social History   Occupational History  . Not on file  Tobacco Use  . Smoking status: Current Every Day Smoker  . Smokeless tobacco: Never Used  Substance and Sexual Activity  . Alcohol use: Yes  . Drug use: No  . Sexual activity: Not on file

## 2018-03-01 ENCOUNTER — Encounter (INDEPENDENT_AMBULATORY_CARE_PROVIDER_SITE_OTHER): Payer: Self-pay | Admitting: Orthopaedic Surgery

## 2018-03-01 ENCOUNTER — Ambulatory Visit (INDEPENDENT_AMBULATORY_CARE_PROVIDER_SITE_OTHER): Payer: 59

## 2018-03-01 ENCOUNTER — Ambulatory Visit (INDEPENDENT_AMBULATORY_CARE_PROVIDER_SITE_OTHER): Payer: 59 | Admitting: Orthopaedic Surgery

## 2018-03-01 VITALS — BP 160/112 | HR 88 | Ht 68.0 in | Wt 145.0 lb

## 2018-03-01 DIAGNOSIS — S42032D Displaced fracture of lateral end of left clavicle, subsequent encounter for fracture with routine healing: Secondary | ICD-10-CM

## 2018-03-01 NOTE — Progress Notes (Signed)
   Office Visit Note   Patient: Christopher Maynard           Date of Birth: 08-31-1985           MRN: [id number] Visit Date: 03/01/2018              Requested by: No referring provider defined for this encounter. PCP: Patient, No Pcp Per   Assessment & Plan: Visit Diagnoses:  1. Closed traumatic minimally displaced fracture of acromial end of left clavicle with routine healing, subsequent encounter     Plan: no work times 2 wks. Clavicle is healing nicely .     Follow-Up Instructions: No follow-ups on file.   Orders:  Orders Placed This Encounter  Procedures  . XR Clavicle Left   No orders of the defined types were placed in this encounter.     Procedures: No procedures performed   Clinical Data: No additional findings.   Subjective: Chief Complaint  Patient presents with  . Left Shoulder - Fracture, Follow-up    DOI 01/23/18 Left Clavicle Fracture    HPI patient returns for follow-up of distal clavicle fracture on the left.  Is been using a sling part of the time removes it some.  Pain is significantly improved.  He is taken some BC powders and did not require the Ultram medication.  Date of injury was 01/23/2018.  Review of Systems 14 point updated unchanged from last office visit.   Objective: Vital Signs: BP (!) 160/112   Pulse 88   Ht 5\' 8"  (1.727 m)   Wt 145 lb (65.8 kg)   BMI 22.05 kg/m   Physical Exam  Ortho Exam  Specialty Comments:  No specialty comments available.  Imaging: Xr Clavicle Left  Result Date: 03/01/2018 2 view x-rays left clavicle obtained and reviewed.  This shows interval healing of periosteal callus formation for the left distal clavicle fracture.  No further displacement noted. Impression: Healing left distal clavicle fracture    PMFS History: Patient Active Problem List   Diagnosis Date Noted  . Traumatic closed fracture of acromial end of left clavicle with minimal displacement 02/08/2018   No past medical history on  file.  No family history on file.  No past surgical history on file. Social History   Occupational History  . Not on file  Tobacco Use  . Smoking status: Current Every Day Smoker  . Smokeless tobacco: Never Used  Substance and Sexual Activity  . Alcohol use: Yes  . Drug use: No  . Sexual activity: Not on file

## 2018-04-28 ENCOUNTER — Other Ambulatory Visit: Payer: Self-pay

## 2018-04-28 ENCOUNTER — Emergency Department (HOSPITAL_COMMUNITY): Payer: 59

## 2018-04-28 ENCOUNTER — Emergency Department (HOSPITAL_COMMUNITY)
Admission: EM | Admit: 2018-04-28 | Discharge: 2018-04-28 | Disposition: A | Discharge status: Home / Self Care | Payer: 59 | Attending: Emergency Medicine | Admitting: Emergency Medicine

## 2018-04-28 DIAGNOSIS — R0789 Other chest pain: Secondary | ICD-10-CM | POA: Diagnosis not present

## 2018-04-28 DIAGNOSIS — F17200 Nicotine dependence, unspecified, uncomplicated: Secondary | ICD-10-CM | POA: Insufficient documentation

## 2018-04-28 DIAGNOSIS — R079 Chest pain, unspecified: Secondary | ICD-10-CM | POA: Diagnosis present

## 2018-04-28 LAB — CBC
HCT: 45.2 % (ref 39.0–52.0)
Hemoglobin: 14.9 g/dL (ref 13.0–17.0)
MCH: 33 pg (ref 26.0–34.0)
MCHC: 33 g/dL (ref 30.0–36.0)
MCV: 100.2 fL — ABNORMAL HIGH (ref 80.0–100.0)
Platelets: 247 10*3/uL (ref 150–400)
RBC: 4.51 MIL/uL (ref 4.22–5.81)
RDW: 12.4 % (ref 11.5–15.5)
WBC: 5.6 10*3/uL (ref 4.0–10.5)
nRBC: 0 % (ref 0.0–0.2)

## 2018-04-28 LAB — POCT I-STAT TROPONIN I: Troponin i, poc: 0 ng/mL (ref 0.00–0.08)

## 2018-04-28 LAB — BASIC METABOLIC PANEL
Anion gap: 8 (ref 5–15)
BUN: 5 mg/dL — ABNORMAL LOW (ref 6–20)
CO2: 31 mmol/L (ref 22–32)
Calcium: 9.6 mg/dL (ref 8.9–10.3)
Chloride: 102 mmol/L (ref 98–111)
Creatinine, Ser: 0.8 mg/dL (ref 0.61–1.24)
GFR calc Af Amer: >60 mL/min (ref >60)
Glucose, Bld: 83 mg/dL (ref 70–99)
Potassium: 3.9 mmol/L (ref 3.5–5.1)
Sodium: 141 mmol/L (ref 135–145)

## 2018-04-28 MED ORDER — IBUPROFEN 400 MG PO TABS: 400.0000 mg | ORAL_TABLET | Freq: Four times a day (QID) | ORAL | 0 refills | Status: DC | PRN

## 2018-04-28 MED ORDER — METHOCARBAMOL 750 MG PO TABS: 750.0000 mg | ORAL_TABLET | Freq: Four times a day (QID) | ORAL | 0 refills | Status: DC

## 2018-04-28 NOTE — ED Triage Notes (Signed)
Pt to ed with C/o of left chest pain. Pt states that when he moves, talks, or coughs feels sharp pain in he left chest. Pain is a 10/10 and started 4 hours ago. Pt has a lump in left upper chest where the pain is. Pt denies any radiation to arm, back, or neck. Pt is hypertensive in Triage 160/117. Pt is A&O x4 and ambulatory.

## 2018-04-28 NOTE — ED Provider Notes (Addendum)
[facility]-EMERGENCY DEPT Provider Note   CSN: [id number] Arrival date & time: 04/28/18  1957     History   Chief Complaint Chief Complaint  Patient presents with  . Chest Pain    HPI Christopher Maynard is a 32 y.o. male.  32 year old male presents with sudden onset of sharp left-sided chest pain is worse with movement.  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  No associated dyspnea or anginal or CHF symptoms.  No recent cough or congestion.  No associated left hand numbness or tingling.  Pain better with remaining still.  No treatment use prior to arrival.     No past medical history on file.  Patient Active Problem List   Diagnosis Date Noted  . Traumatic closed fracture of acromial end of left clavicle with minimal displacement 02/08/2018    No past surgical history on file.      Home Medications    Prior to Admission medications   Medication Sig Start Date End Date Taking? Authorizing Provider  acetaminophen (TYLENOL) 500 MG tablet Take 1 tablet (500 mg total) by mouth every 6 (six) hours as needed. Patient not taking: Reported on 08/26/2017 04/26/15   Cheri Fowler, PA-C  HYDROcodone-acetaminophen (NORCO/VICODIN) 5-325 MG tablet TAKE 1 TABLET BY MOUTH EVERY 8 HOURS AS NEEDED FOR SEVERE PAIN FOR 5 DAYS 01/29/18   [provider]  traMADol (ULTRAM) 50 MG tablet Take 1 tablet (50 mg total) by mouth every 6 (six) hours as needed. Patient not taking: Reported on 03/01/2018 02/08/18   Eldred Manges, MD    Family History No family history on file.  Social History Social History   Tobacco Use  . Smoking status: Current Every Day Smoker  . Smokeless tobacco: Never Used  Substance Use Topics  . Alcohol use: Yes  . Drug use: No     Allergies   Patient has no known allergies.   Review of Systems Review of Systems  All other systems reviewed and are negative.    Physical Exam Updated Vital Signs BP (!) 160/112 (BP  Location: Left Arm)   Temp 99.5 F (37.5 C) (Oral)   Resp (!) 25   Ht 1.727 m (5\' 8" )   Wt 65.8 kg   SpO2 100%   BMI 22.05 kg/m   Physical Exam Vitals signs and nursing note reviewed.  Constitutional:      General: He is not in acute distress.    Appearance: Normal appearance. He is well-developed. He is not toxic-appearing.  HENT:     Head: Normocephalic and atraumatic.  Eyes:     General: Lids are normal.     Conjunctiva/sclera: Conjunctivae normal.     Pupils: Pupils are equal, round, and reactive to light.  Neck:     Musculoskeletal: Normal range of motion and neck supple.     Thyroid: No thyroid mass.     Trachea: No tracheal deviation.  Cardiovascular:     Rate and Rhythm: Normal rate and regular rhythm.     Heart sounds: Normal heart sounds. No murmur. No gallop.   Pulmonary:     Effort: Pulmonary effort is normal. No respiratory distress.     Breath sounds: Normal breath sounds. No stridor. No decreased breath sounds, wheezing, rhonchi or rales.  Chest:     Chest wall: Tenderness present. No crepitus.    Abdominal:     General: Bowel sounds are normal. There is no distension.     Palpations:  Abdomen is soft.     Tenderness: There is no abdominal tenderness. There is no rebound.  Musculoskeletal: Normal range of motion.        General: No tenderness.  Skin:    General: Skin is warm and dry.     Findings: No abrasion or rash.  Neurological:     Mental Status: He is alert and oriented to person, place, and time.     GCS: GCS eye subscore is 4. GCS verbal subscore is 5. GCS motor subscore is 6.     Cranial Nerves: No cranial nerve deficit.     Sensory: No sensory deficit.  Psychiatric:        Speech: Speech normal.        Behavior: Behavior normal.      ED Treatments / Results  Labs (all labs ordered are listed, but only abnormal results are displayed) Labs Reviewed  BASIC METABOLIC PANEL - Abnormal; Notable for the following components:      Result  Value   BUN 5 (*)    All other components within normal limits  CBC - Abnormal; Notable for the following components:   MCV 100.2 (*)    All other components within normal limits  I-STAT TROPONIN, ED  POCT I-STAT TROPONIN I    EKG EKG Interpretation  Date/Time:  Thursday April 28 2018 20:06:17 EST Ventricular Rate:  84 PR Interval:    QRS Duration: 78 QT Interval:  366 QTC Calculation: 433 R Axis:   89 Text Interpretation:  Sinus rhythm Borderline short PR interval RSR' in V1 or V2, probably normal variant ST elev, probable normal early repol pattern No old tracing to compare Confirmed by Lorre NickAllen, Kentaro Alewine ([id number]) on 04/28/2018 10:17:49 PM   Radiology Dg Chest 2 View  Result Date: 04/28/2018 CLINICAL DATA:  Chest pain. EXAM: CHEST - 2 VIEW COMPARISON:  Radiographs of August 26, 2017. FINDINGS: The heart size and mediastinal contours are within normal limits. Both lungs are clear. No pneumothorax or pleural effusion is noted. Severe scoliosis of thoracic and lumbar spine is noted. IMPRESSION: No active cardiopulmonary disease. Electronically Signed   By: Lupita RaiderJames  Green Jr, M.D.   On: 04/28/2018 20:36    Procedures Procedures (including critical care time)  Medications Ordered in ED Medications - No data to display   Initial Impression / Assessment and Plan / ED Course  I have reviewed the triage vital signs and the nursing notes.  Pertinent labs & imaging results that were available during my care of the patient were reviewed by me and considered in my medical decision making (see chart for details).    Patient with chest wall pain here.  Prescribed NSAIDs and muscle relaxants.  No concern for ACS or PE  Final Clinical Impressions(s) / ED Diagnoses   Final diagnoses:  None    ED Discharge Orders    None       Lorre NickAllen, Banner Huckaba, MD 04/28/18 2238    Lorre NickAllen, Makahla Kiser, MD 04/28/18 2238

## 2019-08-04 ENCOUNTER — Other Ambulatory Visit: Payer: Self-pay

## 2019-08-04 ENCOUNTER — Ambulatory Visit (HOSPITAL_COMMUNITY)
Admission: EM | Admit: 2019-08-04 | Discharge: 2019-08-04 | Disposition: A | Discharge status: Home / Self Care | Payer: 59 | Attending: Family Medicine | Admitting: Family Medicine

## 2019-08-04 ENCOUNTER — Encounter (HOSPITAL_COMMUNITY): Payer: Self-pay

## 2019-08-04 DIAGNOSIS — H5462 Unqualified visual loss, left eye, normal vision right eye: Secondary | ICD-10-CM | POA: Diagnosis not present

## 2019-08-04 MED ORDER — FLUORESCEIN SODIUM 1 MG OP STRP
ORAL_STRIP | OPHTHALMIC | Status: AC
  Filled 2019-08-04: qty 1

## 2019-08-04 MED ORDER — TETRACAINE HCL 0.5 % OP SOLN
OPHTHALMIC | Status: AC
  Filled 2019-08-04: qty 4
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
## 2019-08-04 NOTE — Discharge Instructions (Addendum)
Rest tonight Tomorrow, call Dr. Thana Farr to be seen [phone]
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
## 2021-02-11 ENCOUNTER — Ambulatory Visit
Admission: RE | Admit: 2021-02-11 | Discharge: 2021-02-11 | Disposition: A | Discharge status: Home / Self Care | Payer: 59 | Source: Ambulatory Visit | Attending: Family Medicine | Admitting: Family Medicine

## 2021-02-11 ENCOUNTER — Other Ambulatory Visit: Payer: Self-pay

## 2021-02-11 ENCOUNTER — Ambulatory Visit (INDEPENDENT_AMBULATORY_CARE_PROVIDER_SITE_OTHER): Payer: 59 | Admitting: Family Medicine

## 2021-02-11 ENCOUNTER — Ambulatory Visit: Admission: RE | Admit: 2021-02-11 | Payer: 59 | Source: Home / Self Care

## 2021-02-11 ENCOUNTER — Ambulatory Visit
Admission: RE | Admit: 2021-02-11 | Discharge: 2021-02-11 | Disposition: A | Discharge status: Home / Self Care | Payer: 59 | Attending: Family Medicine | Admitting: Family Medicine

## 2021-02-11 ENCOUNTER — Encounter: Payer: Self-pay | Admitting: Family Medicine

## 2021-02-11 VITALS — BP 152/102 | HR 81 | Temp 98.2°F | Ht 68.0 in | Wt 143.0 lb

## 2021-02-11 DIAGNOSIS — Z0000 Encounter for general adult medical examination without abnormal findings: Secondary | ICD-10-CM | POA: Insufficient documentation

## 2021-02-11 DIAGNOSIS — M419 Scoliosis, unspecified: Secondary | ICD-10-CM

## 2021-02-11 DIAGNOSIS — R030 Elevated blood-pressure reading, without diagnosis of hypertension: Secondary | ICD-10-CM | POA: Diagnosis not present

## 2021-02-11 NOTE — Assessment & Plan Note (Signed)
Annual examination completed, risk stratification labs ordered, anticipatory guidance provided.  

## 2021-02-11 NOTE — Assessment & Plan Note (Signed)
Chronic issue previously addressed during childhood, has had no follow-up since.  Given the magnitude of physical exam findings, scoliosis x-rays ordered to evaluate Cobb angle and determine involvement with next steps.

## 2021-02-11 NOTE — Progress Notes (Signed)
Annual Physical Exam Visit  Patient Information:  Patient ID: Christopher Maynard, male DOB: 1985-10-10 Age: 35 y.o. MRN: [id number]   Subjective:   CC: Annual Physical Exam  HPI:  Christopher Maynard is here for their annual physical.  I reviewed the past medical history, family history, social history, surgical history, and allergies today and changes were made as necessary.  Please see the problem list section below for additional details.  Past Medical History: Past Medical History:  Diagnosis Date   Traumatic closed fracture of acromial end of left clavicle with minimal displacement 02/08/2018   Past Surgical History: History reviewed. No pertinent surgical history. Family History: History reviewed. No pertinent family history. Allergies: No Known Allergies Health Maintenance: Health Maintenance  Topic Date Due   HIV Screening  Never done   Hepatitis C Screening  Never done   COVID-19 Vaccine (3 - Booster for Pfizer series) 11/11/2020   INFLUENZA VACCINE  08/15/2021 (Originally 12/16/2020)   TETANUS/TDAP  02/11/2022 (Originally 03/11/2005)   HPV VACCINES  Aged Out    HM Colonoscopy     This patient has no relevant Health Maintenance data.      Medications: No current outpatient medications on file prior to visit.   No current facility-administered medications on file prior to visit.    Review of Systems: No headache, visual changes, nausea, vomiting, diarrhea, constipation, dizziness, abdominal pain, skin rash, fevers, chills, night sweats, swollen lymph nodes, weight loss, chest pain, body aches, joint swelling, muscle aches, shortness of breath, mood changes, visual or auditory hallucinations reported.  Objective:   Vitals:   02/11/21 1506  BP: (!) 152/102  Pulse: 81  Temp: 98.2 F (36.8 C)  SpO2: 98%   Vitals:   02/11/21 1506  Weight: 143 lb (64.9 kg)  Height: 5\' 8"  (1.727 m)   Body mass index is 21.74 kg/m.  General: Well Developed, well  nourished, and in no acute distress.  Neuro: Alert and oriented x3, extra-ocular muscles intact, sensation grossly intact. Cranial nerves II through XII are intact, motor, sensory, and coordinative functions are all intact. HEENT: Normocephalic, atraumatic, pupils equal round reactive to light, neck supple, no masses, no lymphadenopathy, thyroid nonpalpable. Oropharynx, nasopharynx, external ear canals are unremarkable. Skin: Warm and dry, no rashes noted.  Cardiac: Regular rate and rhythm, no murmurs rubs or gallops. No peripheral edema. Pulses symmetric. Respiratory: Clear to auscultation bilaterally. Not using accessory muscles, speaking in full sentences.  Abdominal: Soft, nontender, nondistended, positive bowel sounds, no masses, no organomegaly. Musculoskeletal: Shoulder, elbow, wrist, hip, knee, ankle stable, and with full range of motion. Prominent hemielevation of thorax to right  Impression and Recommendations:   The patient was counselled, risk factors were discussed, and anticipatory guidance given.  Annual physical exam Annual examination completed, risk stratification labs ordered, anticipatory guidance provided.  Scoliosis Chronic issue previously addressed during childhood, has had no follow-up since.  Given the magnitude of physical exam findings, scoliosis x-rays ordered to evaluate Cobb angle and determine involvement with next steps.  Elevated blood pressure reading in office without diagnosis of hypertension Elevated blood pressure recordings presentation and upon recheck, we will recheck this in 2 weeks, educational materials provided.  Orders & Medications Medications: No orders of the defined types were placed in this encounter.  Orders Placed This Encounter  Procedures   DG SCOLIOSIS EVAL COMPLETE SPINE 1 VIEW   CBC   Comprehensive metabolic panel   Hepatitis C Antibody   HIV antibody (with reflex)  TSH Rfx on Abnormal to Free T4     Return in about 2  weeks (around 02/25/2021) for follow-up BP.    Jerrol Banana, MD   Primary Care Sports Medicine [facility]

## 2021-02-11 NOTE — Patient Instructions (Addendum)
-   Obtain labs with orders provided - Obtain x-ray - See an eye doctor for vision check and form completion - Review information provided - Attend eye doctor annually, dentist every 6 months, work towards or maintain 30 minutes of moderate intensity physical activity at least 5 days per week, and consume a balanced diet - Return in 1 year for physical - Contact us for any questions between now and then

## 2021-02-11 NOTE — Assessment & Plan Note (Signed)
Elevated blood pressure recordings presentation and upon recheck, we will recheck this in 2 weeks, educational materials provided.

## 2021-02-12 LAB — COMPREHENSIVE METABOLIC PANEL
ALT: 23 IU/L (ref 0–44)
AST: 50 IU/L — ABNORMAL HIGH (ref 0–40)
Albumin/Globulin Ratio: 2 (ref 1.2–2.2)
Albumin: 4.8 g/dL (ref 4.0–5.0)
Alkaline Phosphatase: 197 IU/L — ABNORMAL HIGH (ref 44–121)
BUN/Creatinine Ratio: 10 (ref 9–20)
BUN: 8 mg/dL (ref 6–20)
Bilirubin Total: <0.2 mg/dL (ref 0.0–1.2)
CO2: 27 mmol/L (ref 20–29)
Calcium: 9.7 mg/dL (ref 8.7–10.2)
Chloride: 99 mmol/L (ref 96–106)
Creatinine, Ser: 0.82 mg/dL (ref 0.76–1.27)
Globulin, Total: 2.4 g/dL (ref 1.5–4.5)
Glucose: 92 mg/dL (ref 70–99)
Potassium: 4.5 mmol/L (ref 3.5–5.2)
Sodium: 140 mmol/L (ref 134–144)
Total Protein: 7.2 g/dL (ref 6.0–8.5)
eGFR: 118 mL/min/{1.73_m2} (ref >59)

## 2021-02-12 LAB — CBC
Hematocrit: 38 % (ref 37.5–51.0)
Hemoglobin: 13.2 g/dL (ref 13.0–17.7)
MCH: 35.2 pg — ABNORMAL HIGH (ref 26.6–33.0)
MCHC: 34.7 g/dL (ref 31.5–35.7)
MCV: 101 fL — ABNORMAL HIGH (ref 79–97)
Platelets: 297 10*3/uL (ref 150–450)
RBC: 3.75 x10E6/uL — ABNORMAL LOW (ref 4.14–5.80)
RDW: 14 % (ref 11.6–15.4)
WBC: 3.8 10*3/uL (ref 3.4–10.8)

## 2021-02-12 LAB — TSH RFX ON ABNORMAL TO FREE T4: TSH: 2.05 u[IU]/mL (ref 0.450–4.500)

## 2021-02-12 LAB — HEPATITIS C ANTIBODY: Hep C Virus Ab: <0.1 s/co ratio (ref 0.0–0.9)

## 2021-02-12 LAB — HIV ANTIBODY (ROUTINE TESTING W REFLEX): HIV Screen 4th Generation wRfx: NONREACTIVE

## 2021-02-12 NOTE — Progress Notes (Signed)
Patient needs to repeat the CMP fasting (can get done locally where he lives) 1-2 days prior to his follow-up with [place]. Please also order a B12 and folate.  Diagnoses: elevated alk phos, macrocytosis / cbc abnormality  Thanks

## 2021-02-13 ENCOUNTER — Telehealth: Payer: Self-pay

## 2021-02-13 DIAGNOSIS — D7589 Other specified diseases of blood and blood-forming organs: Secondary | ICD-10-CM

## 2021-02-13 DIAGNOSIS — R748 Abnormal levels of other serum enzymes: Secondary | ICD-10-CM

## 2021-02-13 DIAGNOSIS — R030 Elevated blood-pressure reading, without diagnosis of hypertension: Secondary | ICD-10-CM

## 2021-02-13 NOTE — Telephone Encounter (Signed)
-----  Message from [name], MD sent at 02/12/2021 10:05 AM EDT ----- Patient needs to repeat the CMP fasting (can get done locally where he lives) 1-2 days prior to his follow-up with [place]. Please also order a B12 and folate.  Diagnoses: elevated alk phos, macrocytosis / cbc abnormality  Thanks

## 2021-02-13 NOTE — Telephone Encounter (Signed)
Patient notified and verbalized understanding.  Labs ordered and mailed to patient to have completed at a [facility] facility in [place]. No further questions at this time. For your information.

## 2021-02-14 ENCOUNTER — Telehealth: Payer: Self-pay

## 2021-02-14 DIAGNOSIS — M419 Scoliosis, unspecified: Secondary | ICD-10-CM

## 2021-02-14 NOTE — Telephone Encounter (Signed)
Referral placed to [facility].

## 2021-02-14 NOTE — Telephone Encounter (Signed)
-----   Message from Jerrol Banana, MD sent at 02/14/2021  8:46 AM EDT ----- Patient needs referral to spine surgeon for severe S shaped scoliosis with thoracic curvature 96 degrees and thoracolumbar curvature 102 degrees.

## 2021-02-14 NOTE — Progress Notes (Signed)
Patient needs referral to spine surgeon for severe S shaped scoliosis with thoracic curvature 96 degrees and thoracolumbar curvature 102 degrees.

## 2021-02-21 LAB — CBC WITH DIFFERENTIAL/PLATELET
Basophils Absolute: 0.1 10*3/uL (ref 0.0–0.2)
Basos: 2 %
EOS (ABSOLUTE): 0.4 10*3/uL (ref 0.0–0.4)
Eos: 6 %
Hematocrit: 43.2 % (ref 37.5–51.0)
Hemoglobin: 15.1 g/dL (ref 13.0–17.7)
Immature Grans (Abs): 0 10*3/uL (ref 0.0–0.1)
Immature Granulocytes: 0 %
Lymphocytes Absolute: 0.9 10*3/uL (ref 0.7–3.1)
Lymphs: 15 %
MCH: 35.2 pg — ABNORMAL HIGH (ref 26.6–33.0)
MCHC: 35 g/dL (ref 31.5–35.7)
MCV: 101 fL — ABNORMAL HIGH (ref 79–97)
Monocytes Absolute: 0.5 10*3/uL (ref 0.1–0.9)
Monocytes: 8 %
Neutrophils Absolute: 4.2 10*3/uL (ref 1.4–7.0)
Neutrophils: 69 %
Platelets: 299 10*3/uL (ref 150–450)
RBC: 4.29 x10E6/uL (ref 4.14–5.80)
RDW: 13.5 % (ref 11.6–15.4)
WBC: 6.1 10*3/uL (ref 3.4–10.8)

## 2021-02-21 LAB — COMPREHENSIVE METABOLIC PANEL
ALT: 23 IU/L (ref 0–44)
AST: 45 IU/L — ABNORMAL HIGH (ref 0–40)
Albumin/Globulin Ratio: 2 (ref 1.2–2.2)
Albumin: 5.2 g/dL — ABNORMAL HIGH (ref 4.0–5.0)
Alkaline Phosphatase: 156 IU/L — ABNORMAL HIGH (ref 44–121)
BUN/Creatinine Ratio: 11 (ref 9–20)
BUN: 9 mg/dL (ref 6–20)
Bilirubin Total: 0.8 mg/dL (ref 0.0–1.2)
CO2: 23 mmol/L (ref 20–29)
Calcium: 10.1 mg/dL (ref 8.7–10.2)
Chloride: 100 mmol/L (ref 96–106)
Creatinine, Ser: 0.82 mg/dL (ref 0.76–1.27)
Globulin, Total: 2.6 g/dL (ref 1.5–4.5)
Glucose: 91 mg/dL (ref 70–99)
Potassium: 4.5 mmol/L (ref 3.5–5.2)
Sodium: 139 mmol/L (ref 134–144)
Total Protein: 7.8 g/dL (ref 6.0–8.5)
eGFR: 118 mL/min/{1.73_m2} (ref >59)

## 2021-02-21 LAB — B12 AND FOLATE PANEL
Folate: 5.1 ng/mL (ref >3.0)
Vitamin B-12: 255 pg/mL (ref 232–1245)

## 2021-02-27 ENCOUNTER — Encounter: Payer: Self-pay | Admitting: Family Medicine

## 2021-02-27 ENCOUNTER — Ambulatory Visit (INDEPENDENT_AMBULATORY_CARE_PROVIDER_SITE_OTHER): Payer: 59 | Admitting: Family Medicine

## 2021-02-27 ENCOUNTER — Other Ambulatory Visit: Payer: Self-pay

## 2021-02-27 VITALS — BP 138/94 | HR 88 | Ht 68.0 in | Wt 140.0 lb

## 2021-02-27 DIAGNOSIS — I10 Essential (primary) hypertension: Secondary | ICD-10-CM

## 2021-02-27 DIAGNOSIS — F1010 Alcohol abuse, uncomplicated: Secondary | ICD-10-CM | POA: Insufficient documentation

## 2021-02-27 DIAGNOSIS — M419 Scoliosis, unspecified: Secondary | ICD-10-CM

## 2021-02-27 MED ORDER — HYDROCHLOROTHIAZIDE 12.5 MG PO TABS: 12.5000 mg | ORAL_TABLET | Freq: Every day | ORAL | 3 refills | Status: DC

## 2021-02-27 NOTE — Assessment & Plan Note (Signed)
Significant scoliotic curvature with recent evaluation by spinal surgeon.  Note is currently unavailable but per patient, plan for surgery is at patient's discretion as he is currently asymptomatic in regards to the scoliosis.  We will follow peripherally on this issue.

## 2021-02-27 NOTE — Progress Notes (Signed)
     Primary Care / Sports Medicine Office Visit  Patient Information:  Patient ID: Christopher Maynard, male DOB: 12-11-1985 Age: 35 y.o. MRN: [id number]   Christopher Maynard is a pleasant 35 y.o. male presenting with the following:  Chief Complaint  Patient presents with   Follow-up    Repeat labs 02/20/21 due to labs from 02/11/21 showing elevated alkaline phosphatase, macrocytosis/cbc abnormality   Scoliosis of thoracolumbar spine    X-Ray 02/11/21    Review of Systems pertinent details above   Patient Active Problem List   Diagnosis Date Noted   Primary hypertension 02/27/2021   Alcohol abuse 02/27/2021   Annual physical exam 02/11/2021   Scoliosis 02/11/2021   Elevated blood pressure reading in office without diagnosis of hypertension 02/11/2021   Past Medical History:  Diagnosis Date   Traumatic closed fracture of acromial end of left clavicle with minimal displacement 02/08/2018   Outpatient Encounter Medications as of 02/27/2021  Medication Sig   hydrochlorothiazide (HYDRODIURIL) 12.5 MG tablet Take 1 tablet (12.5 mg total) by mouth daily.   No facility-administered encounter medications on file as of 02/27/2021.   History reviewed. No pertinent surgical history.  Vitals:   02/27/21 1515  BP: (!) 138/94  Pulse: 88  SpO2: 99%   Vitals:   02/27/21 1515  Weight: 140 lb (63.5 kg)  Height: 5\' 8"  (1.727 m)   Body mass index is 21.29 kg/m.  DG SCOLIOSIS EVAL COMPLETE SPINE 1 VIEW  Result Date: 02/13/2021 CLINICAL DATA:  Scoliosis EXAM: DG SCOLIOSIS EVAL COMPLETE SPINE 1V COMPARISON:  04/28/2018 FINDINGS: A single frontal view of the thoracolumbar spine was obtained. There is marked S shaped scoliosis. Right convex component measures approximately 96 degrees utilizing the superior endplate of T5 and the inferior endplate of T11 as bony landmarks. Left convex lumbar curvature measures approximately 102 degrees utilizing the superior endplate of T11 and the inferior  endplate of L4 as bony landmarks. No acute bony abnormalities. Visualized portions of the lungs are clear. IMPRESSION: 1. Severe S shaped scoliosis of the thoracolumbar spine as above. Electronically Signed   By: 14/04/2018 M.D.   On: 02/13/2021 20:33     Independent interpretation of notes and tests performed by another provider:   None  Procedures performed:   None  Pertinent History, Exam, Impression, and Recommendations:   Primary hypertension Again noted on interval evaluation, he is asymptomatic from a cardiopulmonary and neurologic standpoint.  I have prescribed hydrochlorothiazide 12.5 mg to initiate daily, will maintain close follow-up in 4 weeks to evaluate response.  Scoliosis Significant scoliotic curvature with recent evaluation by spinal surgeon.  Note is currently unavailable but per patient, plan for surgery is at patient's discretion as he is currently asymptomatic in regards to the scoliosis.  We will follow peripherally on this issue.   Orders & Medications Meds ordered this encounter  Medications   hydrochlorothiazide (HYDRODIURIL) 12.5 MG tablet    Sig: Take 1 tablet (12.5 mg total) by mouth daily.    Dispense:  90 tablet    Refill:  3   No orders of the defined types were placed in this encounter.    Return in about 4 weeks (around 03/27/2021).     13/02/2021, MD   Primary Care Sports Medicine [facility]

## 2021-02-27 NOTE — Assessment & Plan Note (Signed)
Again noted on interval evaluation, he is asymptomatic from a cardiopulmonary and neurologic standpoint.  I have prescribed hydrochlorothiazide 12.5 mg to initiate daily, will maintain close follow-up in 4 weeks to evaluate response.

## 2021-02-27 NOTE — Patient Instructions (Signed)
-   Start hydrochlorothiazide once daily - Start over-the-counter vitamin B complex supplement - Decrease/discontinue alcohol intake - Return for follow-up in 4 weeks

## 2021-03-28 ENCOUNTER — Ambulatory Visit: Payer: 59 | Admitting: Family Medicine

## 2021-03-31 ENCOUNTER — Ambulatory Visit: Payer: 59 | Admitting: Family Medicine

## 2021-04-04 ENCOUNTER — Other Ambulatory Visit: Payer: Self-pay

## 2021-04-04 ENCOUNTER — Ambulatory Visit (INDEPENDENT_AMBULATORY_CARE_PROVIDER_SITE_OTHER): Payer: 59 | Admitting: Family Medicine

## 2021-04-04 ENCOUNTER — Encounter: Payer: Self-pay | Admitting: Family Medicine

## 2021-04-04 VITALS — BP 134/96 | HR 104 | Ht 68.0 in | Wt 139.0 lb

## 2021-04-04 DIAGNOSIS — I10 Essential (primary) hypertension: Secondary | ICD-10-CM

## 2021-04-04 DIAGNOSIS — R748 Abnormal levels of other serum enzymes: Secondary | ICD-10-CM

## 2021-04-04 MED ORDER — LISINOPRIL-HYDROCHLOROTHIAZIDE 10-12.5 MG PO TABS: 1.0000 | ORAL_TABLET | Freq: Every day | ORAL | 2 refills | Status: DC

## 2021-04-04 NOTE — Assessment & Plan Note (Signed)
Chronic condition with ongoing suboptimal control.  Fortunately he is asymptomatic and his cardiopulmonary findings today are benign.  We have discussed treatment strategies and he is amenable to medication adjustment.  Plan for lisinopril-HCTZ combo pill at a dose of 10-12.5 mg.  He will continue healthy lifestyle measures, maintain follow-up in 4 weeks.

## 2021-04-04 NOTE — Progress Notes (Signed)
     Primary Care / Sports Medicine Office Visit  Patient Information:  Patient ID: IYAD DEROO, male DOB: 11/01/1985 Age: 35 y.o. MRN: [id number]   Christopher Maynard is a pleasant 35 y.o. male presenting with the following:  Chief Complaint  Patient presents with   Hypertension   Scoliosis    Patient Active Problem List   Diagnosis Date Noted   Primary hypertension 02/27/2021   Alcohol abuse 02/27/2021   Annual physical exam 02/11/2021   Scoliosis 02/11/2021    Vitals:   04/04/21 1546  BP: (!) 134/96  Pulse: (!) 104  SpO2: 98%   Vitals:   04/04/21 1546  Weight: 139 lb (63 kg)  Height: 5\' 8"  (1.727 m)   Body mass index is 21.13 kg/m.  No results found.   Independent interpretation of notes and tests performed by another provider:   None  Procedures performed:   None  Pertinent History, Exam, Impression, and Recommendations:   Primary hypertension Chronic condition with ongoing suboptimal control.  Fortunately he is asymptomatic and his cardiopulmonary findings today are benign.  We have discussed treatment strategies and he is amenable to medication adjustment.  Plan for lisinopril-HCTZ combo pill at a dose of 10-12.5 mg.  He will continue healthy lifestyle measures, maintain follow-up in 4 weeks.   Orders & Medications Meds ordered this encounter  Medications   lisinopril-hydrochlorothiazide (ZESTORETIC) 10-12.5 MG tablet    Sig: Take 1 tablet by mouth daily.    Dispense:  30 tablet    Refill:  2   Orders Placed This Encounter  Procedures   Ambulatory referral to Gastroenterology     Return in about 4 weeks (around 05/02/2021).     05/04/2021, MD   Primary Care Sports Medicine [facility]

## 2021-04-04 NOTE — Patient Instructions (Signed)
-   Dose new blood pressure medication - Referral coordinator will contact you for GI follow-up - Return in 4 weeks, contact for questions

## 2021-05-09 ENCOUNTER — Ambulatory Visit: Payer: 59 | Admitting: Family Medicine

## 2021-05-23 ENCOUNTER — Ambulatory Visit: Payer: 59 | Admitting: Family Medicine

## 2021-05-30 ENCOUNTER — Ambulatory Visit (INDEPENDENT_AMBULATORY_CARE_PROVIDER_SITE_OTHER): Payer: 59 | Admitting: Family Medicine

## 2021-05-30 ENCOUNTER — Other Ambulatory Visit: Payer: Self-pay

## 2021-05-30 ENCOUNTER — Encounter: Payer: Self-pay | Admitting: Family Medicine

## 2021-05-30 VITALS — BP 130/90 | HR 112 | Ht 68.0 in | Wt 141.0 lb

## 2021-05-30 DIAGNOSIS — Z13220 Encounter for screening for lipoid disorders: Secondary | ICD-10-CM | POA: Diagnosis not present

## 2021-05-30 DIAGNOSIS — I10 Essential (primary) hypertension: Secondary | ICD-10-CM

## 2021-05-30 MED ORDER — LISINOPRIL-HYDROCHLOROTHIAZIDE 20-25 MG PO TABS: 1.0000 | ORAL_TABLET | Freq: Every day | ORAL | 2 refills | Status: DC

## 2021-05-30 NOTE — Patient Instructions (Signed)
-   Start new dose of blood pressure medication once daily - Review information on managing hypertension, make lifestyle changes as discussed, reduce alcohol intake - Referral coronary will contact you for scheduling ultrasound and follow-up with cardiology (heart doctor) - Return for follow-up in 1 month, contact for any questions between now and then

## 2021-05-30 NOTE — Progress Notes (Signed)
°  ° °  Primary Care / Sports Medicine Office Visit  Patient Information:  Patient ID: Christopher Maynard, male DOB: 10/17/1985 Age: 36 y.o. MRN: [id number]   Christopher Maynard is a pleasant 36 y.o. male presenting with the following:  Chief Complaint  Patient presents with   Hypertension    Tolerating medication well    Vitals:   05/30/21 1450 05/30/21 1533  BP: (!) 142/92 130/90  Pulse: (!) 112   SpO2: 98%    Vitals:   05/30/21 1450  Weight: 141 lb (64 kg)  Height: 5\' 8"  (1.727 m)   Body mass index is 21.44 kg/m.  No results found.   Independent interpretation of notes and tests performed by another provider:   None  Procedures performed:   None  Pertinent History, Exam, Impression, and Recommendations:   Primary hypertension Patient presents for follow-up to hypertension, at last visit he was prescribed lisinopril-HCTZ 10-12.5 mg.  Initial intake did reveal elevated blood pressures, repeat did show improvement.  He continues to intake multiple alcoholic beverages per day, Gatorade, sodium rich foods.  He denies any chest pain, shortness of air, headaches, and is essentially asymptomatic.  Physical examination does reveal tachycardic rate but otherwise regular rhythm, positive S1-S2, no additional heart sounds.  Lung fields are clear throughout, no peripheral edema noted and he has symmetric pulses.  I have advised further titration of medication, we have ordered renal artery ultrasound, and place referral to cardiology.  I also provided information on lifestyle modifications and we discussed this specifically in light of patient's daily lifestyle at length.   Orders & Medications Meds ordered this encounter  Medications   lisinopril-hydrochlorothiazide (ZESTORETIC) 20-25 MG tablet    Sig: Take 1 tablet by mouth daily.    Dispense:  30 tablet    Refill:  2   Orders Placed This Encounter  Procedures   Renal Artery Stenosis   Comprehensive Metabolic Panel  (CMET)   Lipid Panel With LDL/HDL Ratio   CBC with Differential   Apo A1 + B + Ratio   Ambulatory referral to Cardiology     Return in about 4 weeks (around 06/27/2021).     08/25/2021, MD   Primary Care Sports Medicine [facility]

## 2021-05-30 NOTE — Assessment & Plan Note (Signed)
Patient presents for follow-up to hypertension, at last visit he was prescribed lisinopril-HCTZ 10-12.5 mg.  Initial intake did reveal elevated blood pressures, repeat did show improvement.  He continues to intake multiple alcoholic beverages per day, Gatorade, sodium rich foods.  He denies any chest pain, shortness of air, headaches, and is essentially asymptomatic.  Physical examination does reveal tachycardic rate but otherwise regular rhythm, positive S1-S2, no additional heart sounds.  Lung fields are clear throughout, no peripheral edema noted and he has symmetric pulses.  I have advised further titration of medication, we have ordered renal artery ultrasound, and place referral to cardiology.  I also provided information on lifestyle modifications and we discussed this specifically in light of patient's daily lifestyle at length.

## 2021-06-12 ENCOUNTER — Emergency Department (HOSPITAL_COMMUNITY)
Admission: EM | Admit: 2021-06-12 | Discharge: 2021-06-13 | Disposition: A | Discharge status: Home / Self Care | Payer: 59 | Attending: Emergency Medicine | Admitting: Emergency Medicine

## 2021-06-12 ENCOUNTER — Emergency Department (HOSPITAL_COMMUNITY): Payer: 59

## 2021-06-12 ENCOUNTER — Other Ambulatory Visit: Payer: Self-pay

## 2021-06-12 ENCOUNTER — Encounter (HOSPITAL_COMMUNITY): Payer: Self-pay | Admitting: Emergency Medicine

## 2021-06-12 DIAGNOSIS — Y92410 Unspecified street and highway as the place of occurrence of the external cause: Secondary | ICD-10-CM | POA: Diagnosis not present

## 2021-06-12 DIAGNOSIS — F10920 Alcohol use, unspecified with intoxication, uncomplicated: Secondary | ICD-10-CM

## 2021-06-12 DIAGNOSIS — S199XXA Unspecified injury of neck, initial encounter: Secondary | ICD-10-CM | POA: Diagnosis present

## 2021-06-12 DIAGNOSIS — S12501A Unspecified nondisplaced fracture of sixth cervical vertebra, initial encounter for closed fracture: Secondary | ICD-10-CM

## 2021-06-12 DIAGNOSIS — S80212A Abrasion, left knee, initial encounter: Secondary | ICD-10-CM | POA: Diagnosis not present

## 2021-06-12 DIAGNOSIS — F10129 Alcohol abuse with intoxication, unspecified: Secondary | ICD-10-CM | POA: Insufficient documentation

## 2021-06-12 DIAGNOSIS — S0990XA Unspecified injury of head, initial encounter: Secondary | ICD-10-CM | POA: Insufficient documentation

## 2021-06-12 DIAGNOSIS — S80211A Abrasion, right knee, initial encounter: Secondary | ICD-10-CM | POA: Diagnosis not present

## 2021-06-12 DIAGNOSIS — S12490A Other displaced fracture of fifth cervical vertebra, initial encounter for closed fracture: Secondary | ICD-10-CM | POA: Insufficient documentation

## 2021-06-12 DIAGNOSIS — Z7982 Long term (current) use of aspirin: Secondary | ICD-10-CM | POA: Insufficient documentation

## 2021-06-12 DIAGNOSIS — S3991XA Unspecified injury of abdomen, initial encounter: Secondary | ICD-10-CM | POA: Diagnosis not present

## 2021-06-12 DIAGNOSIS — S0081XA Abrasion of other part of head, initial encounter: Secondary | ICD-10-CM | POA: Insufficient documentation

## 2021-06-12 DIAGNOSIS — Y908 Blood alcohol level of 240 mg/100 ml or more: Secondary | ICD-10-CM | POA: Diagnosis not present

## 2021-06-12 DIAGNOSIS — S12591A Other nondisplaced fracture of sixth cervical vertebra, initial encounter for closed fracture: Secondary | ICD-10-CM | POA: Diagnosis not present

## 2021-06-12 DIAGNOSIS — S129XXA Fracture of neck, unspecified, initial encounter: Secondary | ICD-10-CM
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
## 2021-06-13 ENCOUNTER — Encounter (HOSPITAL_COMMUNITY): Payer: Self-pay

## 2021-06-13 ENCOUNTER — Emergency Department (HOSPITAL_COMMUNITY): Payer: 59

## 2021-06-13 LAB — URINALYSIS, ROUTINE W REFLEX MICROSCOPIC
Bacteria, UA: NONE SEEN
Bilirubin Urine: NEGATIVE
Glucose, UA: NEGATIVE mg/dL
Ketones, ur: NEGATIVE mg/dL
Leukocytes,Ua: NEGATIVE
Nitrite: NEGATIVE
Protein, ur: NEGATIVE mg/dL
Specific Gravity, Urine: 1.003 — ABNORMAL LOW (ref 1.005–1.030)
pH: 7 (ref 5.0–8.0)

## 2021-06-13 LAB — COMPREHENSIVE METABOLIC PANEL
ALT: 34 U/L (ref 0–44)
AST: 75 U/L — ABNORMAL HIGH (ref 15–41)
Albumin: 4.2 g/dL (ref 3.5–5.0)
Alkaline Phosphatase: 124 U/L (ref 38–126)
Anion gap: 12 (ref 5–15)
BUN: 7 mg/dL (ref 6–20)
CO2: 25 mmol/L (ref 22–32)
Calcium: 9.2 mg/dL (ref 8.9–10.3)
Chloride: 99 mmol/L (ref 98–111)
Creatinine, Ser: 0.69 mg/dL (ref 0.61–1.24)
GFR, Estimated: >60 mL/min (ref >60)
Glucose, Bld: 123 mg/dL — ABNORMAL HIGH (ref 70–99)
Potassium: 3.1 mmol/L — ABNORMAL LOW (ref 3.5–5.1)
Sodium: 136 mmol/L (ref 135–145)
Total Bilirubin: 0.6 mg/dL (ref 0.3–1.2)
Total Protein: 7.5 g/dL (ref 6.5–8.1)

## 2021-06-13 LAB — CBC WITH DIFFERENTIAL/PLATELET
Abs Immature Granulocytes: 0.06 10*3/uL (ref 0.00–0.07)
Basophils Absolute: 0.1 10*3/uL (ref 0.0–0.1)
Basophils Relative: 1 %
Eosinophils Absolute: 0.3 10*3/uL (ref 0.0–0.5)
Eosinophils Relative: 4 %
HCT: 38.6 % — ABNORMAL LOW (ref 39.0–52.0)
Hemoglobin: 13.8 g/dL (ref 13.0–17.0)
Immature Granulocytes: 1 %
Lymphocytes Relative: 29 %
Lymphs Abs: 1.7 10*3/uL (ref 0.7–4.0)
MCH: 34.4 pg — ABNORMAL HIGH (ref 26.0–34.0)
MCHC: 35.8 g/dL (ref 30.0–36.0)
MCV: 96.3 fL (ref 80.0–100.0)
Monocytes Absolute: 0.6 10*3/uL (ref 0.1–1.0)
Monocytes Relative: 10 %
Neutro Abs: 3.3 10*3/uL (ref 1.7–7.7)
Neutrophils Relative %: 55 %
Platelets: 307 10*3/uL (ref 150–400)
RBC: 4.01 MIL/uL — ABNORMAL LOW (ref 4.22–5.81)
RDW: 13.4 % (ref 11.5–15.5)
WBC: 6 10*3/uL (ref 4.0–10.5)
nRBC: 0 % (ref 0.0–0.2)

## 2021-06-13 LAB — ETHANOL: Alcohol, Ethyl (B): 338 mg/dL (ref <10)

## 2021-06-13 MED ORDER — IOHEXOL 350 MG/ML SOLN
70.0000 mL | Freq: Once | INTRAVENOUS | Status: AC | PRN
  Administered 2021-06-13: 70 mL via INTRAVENOUS

## 2021-06-13 MED ORDER — ASPIRIN EC 81 MG PO TBEC: 81.0000 mg | DELAYED_RELEASE_TABLET | Freq: Every day | ORAL | 0 refills | Status: DC

## 2021-06-13 MED ORDER — SODIUM CHLORIDE (PF) 0.9 % IJ SOLN
INTRAMUSCULAR | Status: AC
  Filled 2021-06-13: qty 50

## 2021-06-13 MED ORDER — HYDROCODONE-ACETAMINOPHEN 5-325 MG PO TABS
1.0000 | ORAL_TABLET | ORAL | 0 refills | Status: DC | PRN
Start: 2021-06-13 — End: 2021-07-15

## 2021-06-13 MED ORDER — IOHEXOL 300 MG/ML  SOLN
100.0000 mL | Freq: Once | INTRAMUSCULAR | Status: AC | PRN
  Administered 2021-06-13: 100 mL via INTRAVENOUS

## 2021-06-13 NOTE — Discharge Instructions (Addendum)
Call neurosurgery office in the morning to arrange follow-up appt. Remain in the neck collar until told otherwise. Take the prescribed medication as directed. DO NOT MIX PAIN MEDICATION WITH ALCOHOL OR OTHER CONTROLLED SUBSTANCES. Return to the ED for new or worsening symptoms.

## 2021-06-17 ENCOUNTER — Encounter: Payer: Self-pay | Admitting: Family Medicine

## 2021-06-18 NOTE — Telephone Encounter (Signed)
Paperwork received.  Patient has a follow-up appointment on 06/30/21.  Please advise.
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
## 2021-06-23 ENCOUNTER — Other Ambulatory Visit: Payer: Self-pay

## 2021-06-23 ENCOUNTER — Ambulatory Visit (INDEPENDENT_AMBULATORY_CARE_PROVIDER_SITE_OTHER): Payer: 59 | Admitting: Cardiology

## 2021-06-23 ENCOUNTER — Encounter (HOSPITAL_BASED_OUTPATIENT_CLINIC_OR_DEPARTMENT_OTHER): Payer: Self-pay | Admitting: Cardiology

## 2021-06-23 VITALS — BP 126/84 | HR 100 | Ht 68.0 in | Wt 140.0 lb

## 2021-06-23 DIAGNOSIS — Z713 Dietary counseling and surveillance: Secondary | ICD-10-CM | POA: Diagnosis not present

## 2021-06-23 DIAGNOSIS — Z7189 Other specified counseling: Secondary | ICD-10-CM

## 2021-06-23 DIAGNOSIS — I10 Essential (primary) hypertension: Secondary | ICD-10-CM | POA: Diagnosis not present

## 2021-06-23 NOTE — Patient Instructions (Addendum)
Medication Instructions:  Your Physician recommend you continue on your current medication as directed.    *If you need a refill on your cardiac medications before your next appointment, please call your pharmacy*   Lab Work: None ordered today   Testing/Procedures: None ordered today   Follow-Up: At [facility], you and your health needs are our priority.  As part of our continuing mission to provide you with exceptional heart care, we have created designated Provider Care Teams.  These Care Teams include your primary Cardiologist (physician) and Advanced Practice Providers (APPs -  Physician Assistants and Nurse Practitioners) who all work together to provide you with the care you need, when you need it.  We recommend signing up for the patient portal called "MyChart".  Sign up information is provided on this After Visit Summary.  MyChart is used to connect with patients for Virtual Visits (Telemedicine).  Patients are able to view lab/test results, encounter notes, upcoming appointments, etc.  Non-urgent messages can be sent to your provider as well.   To learn more about what you can do with MyChart, go to [email or web address].    Your next appointment:   As needed  The format for your next appointment:   In Person  Provider:   Jodelle Red, MD   how to check blood pressure:  -sit comfortably in a chair, feet uncrossed and flat on floor, for 5-10 minutes  -arm ideally should rest at the level of the heart. However, arm should be relaxed and not tense (for example, do not hold the arm up unsupported)  -avoid exercise, caffeine, and tobacco for at least 30 minutes prior to BP reading  -don't take BP cuff reading over clothes (always place on skin directly)  -I prefer to know how well the medication is working, so I would like you to take your readings 1-2 hours after taking your blood pressure medication if possible   Goal for your blood pressure is consistently  <130/80. If it starts to go up let me know and we may have to add another medication.

## 2021-06-30 ENCOUNTER — Ambulatory Visit: Payer: 59 | Admitting: Family Medicine

## 2021-07-07 ENCOUNTER — Ambulatory Visit: Payer: 59 | Admitting: Family Medicine

## 2021-07-15 ENCOUNTER — Other Ambulatory Visit: Payer: Self-pay | Admitting: Family Medicine

## 2021-07-15 ENCOUNTER — Encounter: Payer: Self-pay | Admitting: Family Medicine

## 2021-07-15 ENCOUNTER — Other Ambulatory Visit: Payer: Self-pay

## 2021-07-15 ENCOUNTER — Ambulatory Visit (INDEPENDENT_AMBULATORY_CARE_PROVIDER_SITE_OTHER): Payer: 59 | Admitting: Family Medicine

## 2021-07-15 VITALS — BP 122/82 | HR 114 | Ht 68.0 in | Wt 141.0 lb

## 2021-07-15 DIAGNOSIS — I10 Essential (primary) hypertension: Secondary | ICD-10-CM

## 2021-07-15 DIAGNOSIS — F1010 Alcohol abuse, uncomplicated: Secondary | ICD-10-CM

## 2021-07-15 MED ORDER — LISINOPRIL-HYDROCHLOROTHIAZIDE 20-25 MG PO TABS: 1.0000 | ORAL_TABLET | Freq: Every day | ORAL | 0 refills | Status: DC

## 2021-07-15 NOTE — Assessment & Plan Note (Signed)
Chronic issue in the setting of alcoholism, has shown improvement following titration of lisinopril as hydrochlorothiazide 20-25.  He is asymptomatic from a cardiopulmonary standpoint and his examination findings are benign.  At this stage plan for 90-day refill, follow-up at that time.
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
## 2021-07-18 ENCOUNTER — Other Ambulatory Visit: Payer: Self-pay | Admitting: Family Medicine

## 2021-07-18 DIAGNOSIS — I10 Essential (primary) hypertension: Secondary | ICD-10-CM

## 2021-07-18 NOTE — Telephone Encounter (Signed)
Requested Prescriptions  ?Pending Prescriptions Disp Refills  ?? lisinopril-hydrochlorothiazide (ZESTORETIC) 20-25 MG tablet [Pharmacy Med Name: LISINOPRIL-HCTZ 20-25 MG TAB] 90 tablet 0  ?  Sig: TAKE 1 TABLET BY MOUTH EVERY DAY  ?  ? Cardiovascular:  ACEI + Diuretic Combos Failed - 07/18/2021  3:16 PM  ?  ?  Failed - K in normal range and within 180 days  ?  Potassium  ?Date Value Ref Range Status  ?06/13/2021 3.1 (L) 3.5 - 5.1 mmol/L Final  ?   ?  ?  Passed - Na in normal range and within 180 days  ?  Sodium  ?Date Value Ref Range Status  ?06/13/2021 136 135 - 145 mmol/L Final  ?02/20/2021 139 134 - 144 mmol/L Final  ?   ?  ?  Passed - Cr in normal range and within 180 days  ?  Creatinine, Ser  ?Date Value Ref Range Status  ?06/13/2021 0.69 0.61 - 1.24 mg/dL Final  ?   ?  ?  Passed - eGFR is 30 or above and within 180 days  ?  GFR calc Af Amer  ?Date Value Ref Range Status  ?04/28/2018 >60 >60 mL/min Final  ? ?GFR, Estimated  ?Date Value Ref Range Status  ?06/13/2021 >60 >60 mL/min Final  ?  Comment:  ?  (NOTE) ?Calculated using the CKD-EPI Creatinine Equation (2021) ?  ? ?eGFR  ?Date Value Ref Range Status  ?02/20/2021 118 >59 mL/min/1.73 Final  ?   ?  ?  Passed - Patient is not pregnant  ?  ?  Passed - Last BP in normal range  ?  BP Readings from Last 1 Encounters:  ?07/15/21 122/82  ?   ?  ?  Passed - Valid encounter within last 6 months  ?  Recent Outpatient Visits   ?      ? 3 days ago Alcohol abuse  ? [facility] [name], MD  ? 1 month ago Primary hypertension  ? [facility] [name], MD  ? 3 months ago Primary hypertension  ? [facility] [name], MD  ? 4 months ago Primary hypertension  ? [facility] [name], MD  ? 5 months ago Annual physical exam  ? [facility] [name], MD  ?  ?  ?Future Appointments   ?        ? In 2 months Zigmund Daniel, Earley Abide, MD [facility] [place]  ?  ? ?  ?  ?  ? ? ?

## 2021-07-18 NOTE — Telephone Encounter (Signed)
[facility] called and spoke to [place], [facility] about the refill(s) lisinopril-hctz requested. Advised it was sent on 07/15/21 #90/0 refill(s). She states that they had sent a refill request for this dose hadn't received anything back from provider. Advised her I will resend it.  ? ?

## 2021-07-23 ENCOUNTER — Encounter: Payer: Self-pay | Admitting: Family Medicine

## 2021-07-24 ENCOUNTER — Other Ambulatory Visit: Payer: Self-pay | Admitting: Family Medicine

## 2021-10-14 ENCOUNTER — Ambulatory Visit: Payer: 59 | Admitting: Family Medicine

## 2021-10-21 ENCOUNTER — Ambulatory Visit: Payer: 59 | Admitting: Family Medicine

## 2021-10-27 ENCOUNTER — Encounter: Payer: Self-pay | Admitting: Family Medicine

## 2021-10-27 ENCOUNTER — Other Ambulatory Visit: Payer: Self-pay

## 2021-10-27 ENCOUNTER — Ambulatory Visit (INDEPENDENT_AMBULATORY_CARE_PROVIDER_SITE_OTHER): Payer: 59 | Admitting: Family Medicine

## 2021-10-27 VITALS — BP 130/110 | HR 100 | Ht 68.0 in | Wt 146.0 lb

## 2021-10-27 DIAGNOSIS — M2540 Effusion, unspecified joint: Secondary | ICD-10-CM | POA: Diagnosis not present

## 2021-10-27 DIAGNOSIS — T7840XA Allergy, unspecified, initial encounter: Secondary | ICD-10-CM | POA: Insufficient documentation

## 2021-10-27 DIAGNOSIS — I10 Essential (primary) hypertension: Secondary | ICD-10-CM

## 2021-10-27 DIAGNOSIS — J301 Allergic rhinitis due to pollen: Secondary | ICD-10-CM

## 2021-10-27 DIAGNOSIS — T7840XS Allergy, unspecified, sequela: Secondary | ICD-10-CM

## 2021-10-27 NOTE — Progress Notes (Signed)
     Primary Care / Sports Medicine Office Visit  Patient Information:  Patient ID: Christopher Maynard, male DOB: 09/24/1985 Age: 36 y.o. MRN: [id number]   Christopher Maynard is a pleasant 36 y.o. male presenting with the following:  Chief Complaint  Patient presents with   Hypertension   Edema    In hands and feet, also itching     Vitals:   10/27/21 1425  BP: (!) 130/110  Pulse: 100  SpO2: 97%   Vitals:   10/27/21 1425  Weight: 146 lb (66.2 kg)  Height: 5\' 8"  (1.727 m)   Body mass index is 22.2 kg/m.  No results found.   Independent interpretation of notes and tests performed by another provider:   None  Procedures performed:   None  Pertinent History, Exam, Impression, and Recommendations:   Problem List Items Addressed This Visit       Cardiovascular and Mediastinum   Primary hypertension    Chronic uncontrolled issue which did not demonstrate adequate control when patient has been compliant with medications.  He states that he has not been compliant with the same as of late with regular dosing of his lisinopril hydrochlorothiazide.  He denies any current complaints and his cardiopulmonary findings today on examination are benign.  We had previously ordered renal artery ultrasounds which have not been processed yet.  Discussed with patient about restarting medications on a regular basis, home BP monitoring, and pursuing previously ordered ultrasound.        Other   Allergic reaction - Primary    Patient with significant childhood environmental allergies (grass clippings) who been relatively asymptomatic for the past few years where he began to note relapsing remitting swelling involving joints, forearm, lips.  He denies any shortness of air or difficulty breathing during these episodes and they resolved with diphenhydramine.  He cannot ascertain a specific trigger in regards to exposures and is able to consume shellfish, nuts, etc. Labs ordered today.  I advised  the patient to seek emergent medical evaluation for any respiratory symptoms, will place referral to allergist for further evaluation.      Relevant Orders   Comprehensive metabolic panel   CBC with Differential/Platelet   Culture, blood (single) w Reflex to ID Panel   Ambulatory referral to Allergy   Joint swelling    See additional assessment(s) for plan details.      Relevant Orders   Comprehensive metabolic panel   CBC with Differential/Platelet   Culture, blood (single) w Reflex to ID Panel   Ambulatory referral to Allergy     Orders & Medications No orders of the defined types were placed in this encounter.  Orders Placed This Encounter  Procedures   Culture, blood (single) w Reflex to ID Panel   Comprehensive metabolic panel   CBC with Differential/Platelet   Ambulatory referral to Allergy     Return in about 3 months (around 01/27/2022).     Christopher Culver, MD   Primary Care Sports Medicine [place]

## 2021-10-27 NOTE — Assessment & Plan Note (Signed)
Chronic uncontrolled issue which did not demonstrate adequate control when patient has been compliant with medications.  He states that he has not been compliant with the same as of late with regular dosing of his lisinopril hydrochlorothiazide.  He denies any current complaints and his cardiopulmonary findings today on examination are benign.  We had previously ordered renal artery ultrasounds which have not been processed yet.  Discussed with patient about restarting medications on a regular basis, home BP monitoring, and pursuing previously ordered ultrasound.

## 2021-10-27 NOTE — Assessment & Plan Note (Addendum)
Patient with significant childhood environmental allergies (grass clippings) who been relatively asymptomatic for the past few years where he began to note relapsing remitting swelling involving joints, forearm, lips.  He denies any shortness of air or difficulty breathing during these episodes and they resolved with diphenhydramine.  He cannot ascertain a specific trigger in regards to exposures and is able to consume shellfish, nuts, etc. Labs ordered today.  I advised the patient to seek emergent medical evaluation for any respiratory symptoms, will place referral to allergist for further evaluation.

## 2021-10-27 NOTE — Assessment & Plan Note (Signed)
See additional assessment(s) for plan details. 

## 2021-10-28 LAB — COMPREHENSIVE METABOLIC PANEL
ALT: 58 IU/L — ABNORMAL HIGH (ref 0–44)
AST: 49 IU/L — ABNORMAL HIGH (ref 0–40)
Albumin/Globulin Ratio: 1.5 (ref 1.2–2.2)
Albumin: 5 g/dL (ref 4.0–5.0)
Alkaline Phosphatase: 185 IU/L — ABNORMAL HIGH (ref 44–121)
BUN/Creatinine Ratio: 10 (ref 9–20)
BUN: 10 mg/dL (ref 6–20)
Bilirubin Total: 0.4 mg/dL (ref 0.0–1.2)
CO2: 21 mmol/L (ref 20–29)
Calcium: 10.3 mg/dL — ABNORMAL HIGH (ref 8.7–10.2)
Chloride: 97 mmol/L (ref 96–106)
Creatinine, Ser: 0.99 mg/dL (ref 0.76–1.27)
Globulin, Total: 3.3 g/dL (ref 1.5–4.5)
Glucose: 108 mg/dL — ABNORMAL HIGH (ref 70–99)
Potassium: 4.2 mmol/L (ref 3.5–5.2)
Sodium: 136 mmol/L (ref 134–144)
Total Protein: 8.3 g/dL (ref 6.0–8.5)
eGFR: 102 mL/min/{1.73_m2} (ref >59)

## 2021-10-28 LAB — CBC WITH DIFFERENTIAL/PLATELET
Basophils Absolute: 0.1 10*3/uL (ref 0.0–0.2)
Basos: 1 %
EOS (ABSOLUTE): 0.5 10*3/uL — ABNORMAL HIGH (ref 0.0–0.4)
Eos: 9 %
Hematocrit: 46.8 % (ref 37.5–51.0)
Hemoglobin: 16.5 g/dL (ref 13.0–17.7)
Immature Grans (Abs): 0 10*3/uL (ref 0.0–0.1)
Immature Granulocytes: 0 %
Lymphocytes Absolute: 1.2 10*3/uL (ref 0.7–3.1)
Lymphs: 20 %
MCH: 32.4 pg (ref 26.6–33.0)
MCHC: 35.3 g/dL (ref 31.5–35.7)
MCV: 92 fL (ref 79–97)
Monocytes Absolute: 0.5 10*3/uL (ref 0.1–0.9)
Monocytes: 9 %
Neutrophils Absolute: 3.7 10*3/uL (ref 1.4–7.0)
Neutrophils: 61 %
Platelets: 321 10*3/uL (ref 150–450)
RBC: 5.09 x10E6/uL (ref 4.14–5.80)
RDW: 12.4 % (ref 11.6–15.4)
WBC: 5.9 10*3/uL (ref 3.4–10.8)

## 2021-11-02 LAB — CULTURE, BLOOD (SINGLE)

## 2021-11-06 ENCOUNTER — Encounter: Payer: Self-pay | Admitting: Family Medicine

## 2021-11-12 ENCOUNTER — Emergency Department (HOSPITAL_COMMUNITY)
Admission: EM | Admit: 2021-11-12 | Discharge: 2021-11-12 | Disposition: A | Discharge status: Home / Self Care | Payer: 59 | Attending: Emergency Medicine | Admitting: Emergency Medicine

## 2021-11-12 ENCOUNTER — Emergency Department (HOSPITAL_COMMUNITY): Payer: 59

## 2021-11-12 ENCOUNTER — Encounter (HOSPITAL_COMMUNITY): Payer: Self-pay

## 2021-11-12 DIAGNOSIS — K4090 Unilateral inguinal hernia, without obstruction or gangrene, not specified as recurrent: Secondary | ICD-10-CM | POA: Diagnosis not present

## 2021-11-12 DIAGNOSIS — R1031 Right lower quadrant pain: Secondary | ICD-10-CM | POA: Diagnosis present

## 2021-11-12 DIAGNOSIS — Z7982 Long term (current) use of aspirin: Secondary | ICD-10-CM | POA: Diagnosis not present

## 2021-11-12 LAB — CBC WITH DIFFERENTIAL/PLATELET
Abs Immature Granulocytes: 0.03 10*3/uL (ref 0.00–0.07)
Basophils Absolute: 0.1 10*3/uL (ref 0.0–0.1)
Basophils Relative: 1 %
Eosinophils Absolute: 0.4 10*3/uL (ref 0.0–0.5)
Eosinophils Relative: 5 %
HCT: 41 % (ref 39.0–52.0)
Hemoglobin: 13.8 g/dL (ref 13.0–17.0)
Immature Granulocytes: 0 %
Lymphocytes Relative: 10 %
Lymphs Abs: 0.7 10*3/uL (ref 0.7–4.0)
MCH: 31.8 pg (ref 26.0–34.0)
MCHC: 33.7 g/dL (ref 30.0–36.0)
MCV: 94.5 fL (ref 80.0–100.0)
Monocytes Absolute: 0.4 10*3/uL (ref 0.1–1.0)
Monocytes Relative: 6 %
Neutro Abs: 5.7 10*3/uL (ref 1.7–7.7)
Neutrophils Relative %: 78 %
Platelets: 285 10*3/uL (ref 150–400)
RBC: 4.34 MIL/uL (ref 4.22–5.81)
RDW: 12.6 % (ref 11.5–15.5)
WBC: 7.3 10*3/uL (ref 4.0–10.5)
nRBC: 0 % (ref 0.0–0.2)

## 2021-11-12 LAB — COMPREHENSIVE METABOLIC PANEL
ALT: 32 U/L (ref 0–44)
AST: 42 U/L — ABNORMAL HIGH (ref 15–41)
Albumin: 4 g/dL (ref 3.5–5.0)
Alkaline Phosphatase: 113 U/L (ref 38–126)
Anion gap: 7 (ref 5–15)
BUN: 11 mg/dL (ref 6–20)
CO2: 26 mmol/L (ref 22–32)
Calcium: 9.3 mg/dL (ref 8.9–10.3)
Chloride: 106 mmol/L (ref 98–111)
Creatinine, Ser: 0.89 mg/dL (ref 0.61–1.24)
GFR, Estimated: >60 mL/min (ref >60)
Glucose, Bld: 124 mg/dL — ABNORMAL HIGH (ref 70–99)
Potassium: 3.8 mmol/L (ref 3.5–5.1)
Sodium: 139 mmol/L (ref 135–145)
Total Bilirubin: 0.6 mg/dL (ref 0.3–1.2)
Total Protein: 7.2 g/dL (ref 6.5–8.1)

## 2021-11-12 LAB — LIPASE, BLOOD: Lipase: 27 U/L (ref 11–51)

## 2021-11-12 LAB — URINALYSIS, ROUTINE W REFLEX MICROSCOPIC
Bacteria, UA: NONE SEEN
Bilirubin Urine: NEGATIVE
Glucose, UA: NEGATIVE mg/dL
Hgb urine dipstick: NEGATIVE
Ketones, ur: 5 mg/dL — AB
Leukocytes,Ua: NEGATIVE
Nitrite: NEGATIVE
Protein, ur: NEGATIVE mg/dL
Specific Gravity, Urine: 1.015 (ref 1.005–1.030)
pH: 5 (ref 5.0–8.0)

## 2021-11-12 MED ORDER — IOHEXOL 300 MG/ML  SOLN
100.0000 mL | Freq: Once | INTRAMUSCULAR | Status: AC | PRN
  Administered 2021-11-12: 100 mL via INTRAVENOUS

## 2021-11-12 NOTE — Discharge Instructions (Signed)
We saw a small inguinal hernia on your CT scan.  Since you are symptom-free at this point, we will have you follow-up with general surgery outpatient.  Please return if your symptoms come back.

## 2021-11-12 NOTE — ED Provider Triage Note (Signed)
Emergency Medicine Provider Triage Evaluation Note  Christopher Maynard , a 36 y.o. male  was evaluated in triage.  Pt complains of abdominal pain in the right lower quadrant since this morning.  Denies fever, chills, nausea, vomiting, dysuria, or other complaints.  He states he has noticed some swelling in his right groin.  Denies lifting anything heavy.  States he is on disability.  Review of Systems  Positive: As above Negative: As above  Physical Exam  BP (!) 157/108 (BP Location: Left Arm)   Pulse 65   Temp 97.7 F (36.5 C)   Resp 16   Ht 5\' 8"  (1.727 m)   Wt 66.2 kg   SpO2 100%   BMI 22.20 kg/m  Gen:   Awake, no distress   Resp:  Normal effort  MSK:   Moves extremities without difficulty  Other:  Right-sided abdominal tenderness present.  No apparent swelling noted on exam.  Medical Decision Making  Medically screening exam initiated at 4:06 PM.  Appropriate orders placed.  HARL WIECHMANN was informed that the remainder of the evaluation will be completed by another provider, this initial triage assessment does not replace that evaluation, and the importance of remaining in the ED until their evaluation is complete.     Margorie John, PA-C 11/12/21 1607

## 2021-11-12 NOTE — ED Triage Notes (Signed)
Pt c/o sudden onset RLQ abdominal pain earlier this morning. No N/V/D. Pt was given 100 mcg fentanyl enroute with EMS with no relief.

## 2021-11-12 NOTE — ED Provider Notes (Signed)
[facility]-EMERGENCY DEPT Provider Note   CSN: [id number] Arrival date & time: 11/12/21  1535     History  Chief Complaint  Patient presents with   Abdominal Pain    KAHNER YANIK is a 36 y.o. male. Patient presents with sudden onset right lower quadrant abdominal pain that started when he bent over today while taking out the trash.  He says he noticed a bulge in his inguinal area at the time.  He tried to take Tums without any relief.  He did up calling EMS where he was given fentanyl on the way here.  He says his pain went away while he was in the waiting room.  He is absolutely pain-free now and he says that he is walked to the bathroom without any difficulty.  He no longer feels the bulge.   Abdominal Pain Associated symptoms: no chills, no constipation, no diarrhea, no dysuria, no fever, no hematuria, no nausea and no vomiting        Home Medications Prior to Admission medications   Medication Sig Start Date End Date Taking? Authorizing Provider  aspirin EC 81 MG tablet Take 1 tablet (81 mg total) by mouth daily. Swallow whole. Patient not taking: Reported on 10/27/2021 06/13/21   Garlon Hatchet, PA-C  lisinopril-hydrochlorothiazide (ZESTORETIC) 20-25 MG tablet TAKE 1 TABLET BY MOUTH EVERY DAY 07/18/21   Jerrol Banana, MD      Allergies    Patient has no known allergies.    Review of Systems   Review of Systems  Constitutional:  Negative for chills and fever.  Gastrointestinal:  Positive for abdominal pain. Negative for blood in stool, constipation, diarrhea, nausea and vomiting.  Genitourinary:  Negative for dysuria, flank pain, hematuria, scrotal swelling and testicular pain.  Musculoskeletal:  Negative for back pain.  All other systems reviewed and are negative.   Physical Exam Updated Vital Signs BP (!) 158/106   Pulse 78   Temp 97.7 F (36.5 C)   Resp 16   Ht 5\' 8"  (1.727 m)   Wt 66.2 kg   SpO2 100%   BMI 22.20 kg/m   Physical Exam Vitals and nursing note reviewed.  Constitutional:      General: He is not in acute distress.    Appearance: Normal appearance. He is not ill-appearing, toxic-appearing or diaphoretic.  HENT:     Head: Normocephalic and atraumatic.     Nose: No nasal deformity.     Mouth/Throat:     Lips: Pink. No lesions.     Mouth: Mucous membranes are moist. No injury, lacerations, oral lesions or angioedema.     Pharynx: Oropharynx is clear. Uvula midline. No pharyngeal swelling, oropharyngeal exudate, posterior oropharyngeal erythema or uvula swelling.  Eyes:     General: Gaze aligned appropriately. No scleral icterus.       Right eye: No discharge.        Left eye: No discharge.     Conjunctiva/sclera: Conjunctivae normal.     Right eye: Right conjunctiva is not injected. No exudate or hemorrhage.    Left eye: Left conjunctiva is not injected. No exudate or hemorrhage.    Pupils: Pupils are equal, round, and reactive to light.  Cardiovascular:     Rate and Rhythm: Normal rate and regular rhythm.     Pulses: Normal pulses.          Radial pulses are 2+ on the right side and 2+ on the left side.  Dorsalis pedis pulses are 2+ on the right side and 2+ on the left side.     Heart sounds: Normal heart sounds, S1 normal and S2 normal. Heart sounds not distant. No murmur heard.    No friction rub. No gallop. No S3 or S4 sounds.  Pulmonary:     Effort: Pulmonary effort is normal. No accessory muscle usage or respiratory distress.     Breath sounds: Normal breath sounds. No stridor. No wheezing, rhonchi or rales.  Chest:     Chest wall: No tenderness.  Abdominal:     General: Abdomen is flat. Bowel sounds are normal. There is no distension.     Palpations: Abdomen is soft. There is no mass or pulsatile mass.     Tenderness: There is no abdominal tenderness. There is no guarding or rebound.     Hernia: No hernia is present.     Comments: There is no tenderness to palpation of the  right lower quadrant.  Inguinal hernia is reduced and I cannot palpate this.  Musculoskeletal:     Right lower leg: No edema.     Left lower leg: No edema.  Skin:    General: Skin is warm and dry.     Coloration: Skin is not jaundiced or pale.     Findings: No bruising, erythema, lesion or rash.  Neurological:     General: No focal deficit present.     Mental Status: He is alert and oriented to person, place, and time.     GCS: GCS eye subscore is 4. GCS verbal subscore is 5. GCS motor subscore is 6.  Psychiatric:        Mood and Affect: Mood normal.        Behavior: Behavior normal. Behavior is cooperative.     ED Results / Procedures / Treatments   Labs (all labs ordered are listed, but only abnormal results are displayed) Labs Reviewed  COMPREHENSIVE METABOLIC PANEL - Abnormal; Notable for the following components:      Result Value   Glucose, Bld 124 (*)    AST 42 (*)    All other components within normal limits  CBC WITH DIFFERENTIAL/PLATELET  LIPASE, BLOOD  URINALYSIS, ROUTINE W REFLEX MICROSCOPIC    EKG None  Radiology CT ABDOMEN PELVIS W CONTRAST  Result Date: 11/12/2021 CLINICAL DATA:  Right lower quadrant abdominal pain EXAM: CT ABDOMEN AND PELVIS WITH CONTRAST TECHNIQUE: Multidetector CT imaging of the abdomen and pelvis was performed using the standard protocol following bolus administration of intravenous contrast. RADIATION DOSE REDUCTION: This exam was performed according to the departmental dose-optimization program which includes automated exposure control, adjustment of the mA and/or kV according to patient size and/or use of iterative reconstruction technique. CONTRAST:  OMNIPAQUE IOHEXOL 300 MG/ML  SOLN COMPARISON:  06/13/2021 FINDINGS: Lower chest: No acute abnormality. Hepatobiliary: No focal liver abnormality is seen. No gallstones, gallbladder wall thickening, or biliary dilatation. Pancreas: Unremarkable. No pancreatic ductal dilatation or  surrounding inflammatory changes. Spleen: Normal in size without focal abnormality. Adrenals/Urinary Tract: Adrenal glands are unremarkable. Kidneys are normal, without renal calculi, focal lesion, or hydronephrosis. Bladder is unremarkable. Stomach/Bowel: Stomach is within normal limits. Appendix appears normal. No evidence of bowel wall thickening, distention, or inflammatory changes. Vascular/Lymphatic: Trace infrarenal aorta atherosclerosis. No aneurysm. No acute finding. No bulky adenopathy. Reproductive: No significant finding by CT. Other: No abdominal wall hernia or abnormality. No abdominopelvic ascites. Small right inguinal hernia containing fat and a trace amount of fluid. Musculoskeletal:  Severe thoraco lumbar scoliosis again noted. IMPRESSION: No acute intra-abdominal or pelvic finding. Small right inguinal hernia. Severe thoracolumbar scoliosis. Electronically Signed   By: Judie Petit.  Shick M.D.   On: 11/12/2021 18:15    Procedures Procedures   Medications Ordered in ED Medications  iohexol (OMNIPAQUE) 300 MG/ML solution 100 mL (100 mLs Intravenous Contrast Given 11/12/21 1746)    ED Course/ Medical Decision Making/ A&P                           Medical Decision Making   MDM  This is a 36 y.o. male who presents to the ED with RLQ abdominal pain The differential of this patient includes but is not limited to Appendicitis, hernia, Diverticulitis, Testicular Torsion, UTI, Constipation, Epididymitis/Orchitis   My Impression, Plan, and ED Course:  Patient is well-appearing and in no acute distress.  He has stable vitals.  He is completely symptom-free.  His abdominal exam is unremarkable without any evidence of inguinal hernia.  His work-up was already initiated in triage.  I personally ordered, reviewed, and interpreted all laboratory work and imaging and agree with radiologist interpretation. Results interpreted below: CBC unremarkable.  CMP reveals glucose of 124 with AST of 42.  These are  nonspecific given unremarkable findings.  Lipase 27. CT shows no evidence of urolithiasis, appendicitis, diverticulitis, or other acute abnormality.  There is a small right inguinal hernia.   In the presence of patient's inguinal hernia as well as symptoms suggesting a bulge in the right inguinal area while the pain acutely started today.  I suspect this was due to the hernia.  He is completely asymptomatic at this point and hernia has self reduced.  I do not feel he requires any further management or work-up.  I recommend that he follow-up with general surgery outpatient.  Return precautions if pain returns.    Charting Requirements Additional history is obtained from:  Independent historian External Records from outside source obtained and reviewed including: n/a Social Determinants of Health:  none Pertinant PMH that complicates patient's illness: n/a  Patient Care Problems that were addressed during this visit: - RLQ abdominal pain: Acute illness with complication - Inguinal Hernia: Acute illness with systemic symptoms Medications given in ED: n/a Reevaluation of the patient after these medicines showed that the patient resolved I have reviewed home medications and made changes accordingly.  Critical Care Interventions: n/a Consultations: n/a Disposition: discharge  Portions of this note were generated with Dragon dictation software. Dictation errors may occur despite best attempts at proofreading.    Final Clinical Impression(s) / ED Diagnoses Final diagnoses:  Non-recurrent unilateral inguinal hernia without obstruction or gangrene  Right lower quadrant abdominal pain    Rx / DC Orders ED Discharge Orders     None         Therese Sarah 11/12/21 2004    Gwyneth Sprout, MD 11/13/21 0025

## 2021-11-13 ENCOUNTER — Encounter: Payer: Self-pay | Admitting: Family Medicine

## 2021-11-13 NOTE — Telephone Encounter (Signed)
FYI

## 2021-11-14 ENCOUNTER — Ambulatory Visit
Admission: RE | Admit: 2021-11-14 | Discharge: 2021-11-14 | Disposition: A | Discharge status: Home / Self Care | Payer: 59 | Source: Ambulatory Visit | Attending: Family Medicine | Admitting: Family Medicine

## 2021-11-14 ENCOUNTER — Encounter: Payer: Self-pay | Admitting: Family Medicine

## 2021-11-14 DIAGNOSIS — I10 Essential (primary) hypertension: Secondary | ICD-10-CM

## 2022-01-27 ENCOUNTER — Ambulatory Visit: Payer: 59 | Admitting: Family Medicine

## 2022-02-03 ENCOUNTER — Ambulatory Visit: Payer: 59 | Admitting: Family Medicine

## 2022-02-11 ENCOUNTER — Ambulatory Visit: Payer: 59 | Admitting: Family Medicine

## 2022-02-27 ENCOUNTER — Ambulatory Visit: Payer: 59 | Admitting: Family Medicine

## 2022-03-09 ENCOUNTER — Ambulatory Visit (INDEPENDENT_AMBULATORY_CARE_PROVIDER_SITE_OTHER): Payer: 59 | Admitting: Family Medicine

## 2022-03-09 ENCOUNTER — Encounter: Payer: Self-pay | Admitting: Family Medicine

## 2022-03-09 ENCOUNTER — Other Ambulatory Visit: Payer: Self-pay | Admitting: Family Medicine

## 2022-03-09 VITALS — BP 160/120 | HR 74 | Ht 68.0 in | Wt 144.0 lb

## 2022-03-09 DIAGNOSIS — F419 Anxiety disorder, unspecified: Secondary | ICD-10-CM | POA: Diagnosis not present

## 2022-03-09 DIAGNOSIS — I10 Essential (primary) hypertension: Secondary | ICD-10-CM

## 2022-03-09 DIAGNOSIS — F1010 Alcohol abuse, uncomplicated: Secondary | ICD-10-CM

## 2022-03-09 MED ORDER — AMLODIPINE BESYLATE 10 MG PO TABS: 10.0000 mg | ORAL_TABLET | Freq: Every day | ORAL | 0 refills | Status: DC

## 2022-03-09 MED ORDER — SERTRALINE HCL 50 MG PO TABS: 50.0000 mg | ORAL_TABLET | Freq: Every day | ORAL | 0 refills | Status: DC

## 2022-03-09 MED ORDER — LISINOPRIL-HYDROCHLOROTHIAZIDE 20-25 MG PO TABS: 1.0000 | ORAL_TABLET | Freq: Every day | ORAL | 0 refills | Status: DC

## 2022-03-09 NOTE — Assessment & Plan Note (Signed)
Endorsed by patient in setting of recalcitrant BP issues, comorbid alcohol abuse. Plan for initiation of sertraline daily, close follow-up.

## 2022-03-09 NOTE — Progress Notes (Signed)
-   medicaiotn mood alcohol - bp -f/u card - alc abuse - note

## 2022-03-09 NOTE — Assessment & Plan Note (Signed)
Ongoing problem, has been through rehab in past - not amenable at this stage. Open to counseling. Referral placed.

## 2022-03-09 NOTE — Patient Instructions (Addendum)
-   Continue current BP medication - Start new amlodipine daily - Contact number below to follow-up with cardiology - Start sertraline daily - Referral coordinator will contact to schedule psychology - Return in 2 months  [facility] [place]: [address] [place], [place], [place] [address] Phone: [phone]

## 2022-03-09 NOTE — Assessment & Plan Note (Signed)
Recalcitrant, chronic issue. Endorses regular compliance. Cardiopulmonary exam significant for prominent S1 and S2, otherwise RRR, no additional sounds, clear lung fields.   After compliance was again confirmed, plan for additional amlodipine and advised return visit with cardiology. Also discussed alcohol cessation and stress management.

## 2022-03-30 ENCOUNTER — Encounter: Payer: Self-pay | Admitting: Family Medicine

## 2022-03-30 NOTE — Telephone Encounter (Signed)
Please advise 

## 2022-05-07 ENCOUNTER — Other Ambulatory Visit: Payer: Self-pay | Admitting: Family Medicine

## 2022-05-07 DIAGNOSIS — I10 Essential (primary) hypertension: Secondary | ICD-10-CM

## 2022-05-07 DIAGNOSIS — F419 Anxiety disorder, unspecified: Secondary | ICD-10-CM

## 2022-05-07 NOTE — Telephone Encounter (Signed)
Requested Prescriptions  Pending Prescriptions Disp Refills   amLODipine (NORVASC) 10 MG tablet [Pharmacy Med Name: AMLODIPINE BESYLATE 10 MG TAB] 90 tablet 1    Sig: TAKE 1 TABLET BY MOUTH EVERY DAY     Cardiovascular: Calcium Channel Blockers 2 Failed - 05/07/2022  1:51 AM      Failed - Last BP in normal range    BP Readings from Last 1 Encounters:  03/09/22 (!) 160/120         Passed - Last Heart Rate in normal range    Pulse Readings from Last 1 Encounters:  03/09/22 74         Passed - Valid encounter within last 6 months    Recent Outpatient Visits           1 month ago Primary hypertension   [place] [facility], Ocie Bob, MD   6 months ago Allergic reaction, sequela   [place] [facility] at [facility], Ocie Bob, MD   9 months ago Alcohol abuse   [place] [facility] at [facility], Ocie Bob, MD   11 months ago Primary hypertension   [place] [facility] at [facility], Ocie Bob, MD   1 year ago Primary hypertension   [place] [facility] at [facility], Ocie Bob, MD       Future Appointments             In 5 days Ashley Royalty, Ocie Bob, MD [facility] at [facility]             sertraline (ZOLOFT) 50 MG tablet [Pharmacy Med Name: SERTRALINE HCL 50 MG TABLET] 90 tablet 1    Sig: TAKE 1 TABLET BY MOUTH EVERY DAY     Psychiatry:  Antidepressants - SSRI - sertraline Failed - 05/07/2022  1:51 AM      Failed - AST in normal range and within 360 days    AST  Date Value Ref Range Status  11/12/2021 42 (H) 15 - 41 U/L Final         Passed - ALT in normal range and within 360 days    ALT  Date Value Ref Range Status  11/12/2021 32 0 - 44 U/L Final         Passed - Completed PHQ-2 or PHQ-9 in the last 360 days      Passed  - Valid encounter within last 6 months    Recent Outpatient Visits           1 month ago Primary hypertension   [place] [facility] at [facility], Ocie Bob, MD   6 months ago Allergic reaction, sequela   [place] [facility] at [facility], Ocie Bob, MD   9 months ago Alcohol abuse   [place] [facility] at [facility], Ocie Bob, MD   11 months ago Primary hypertension   [place] [facility] at [facility], Ocie Bob, MD   1 year ago Primary hypertension    Primary Care and Sports Medicine at [facility], Ocie Bob, MD       Future Appointments             In 5 days  Jerrol Banana, MD [facility] and [facility] Medicine at [facility], [facility]

## 2022-05-12 ENCOUNTER — Ambulatory Visit: Payer: 59 | Admitting: Family Medicine

## 2022-05-14 ENCOUNTER — Other Ambulatory Visit: Payer: Self-pay | Admitting: Family Medicine

## 2022-05-14 ENCOUNTER — Encounter: Payer: Self-pay | Admitting: Family Medicine

## 2022-05-14 DIAGNOSIS — I10 Essential (primary) hypertension: Secondary | ICD-10-CM

## 2022-05-15 ENCOUNTER — Other Ambulatory Visit (HOSPITAL_COMMUNITY): Payer: Self-pay

## 2022-05-15 ENCOUNTER — Other Ambulatory Visit: Payer: Self-pay

## 2022-05-15 MED ORDER — LISINOPRIL-HYDROCHLOROTHIAZIDE 20-25 MG PO TABS
1.0000 | ORAL_TABLET | Freq: Every day | ORAL | 0 refills | Status: DC
  Filled 2022-05-15: qty 30, 30d supply, fill #0

## 2022-05-15 NOTE — Telephone Encounter (Signed)
Please review.  KP

## 2022-05-19 ENCOUNTER — Other Ambulatory Visit (HOSPITAL_COMMUNITY): Payer: Self-pay

## 2022-05-19 ENCOUNTER — Encounter (HOSPITAL_COMMUNITY): Payer: Self-pay

## 2022-05-20 ENCOUNTER — Ambulatory Visit: Payer: 59 | Admitting: Family Medicine

## 2022-05-21 ENCOUNTER — Other Ambulatory Visit: Payer: Self-pay

## 2022-05-22 ENCOUNTER — Encounter: Payer: Self-pay | Admitting: Family Medicine

## 2022-05-22 ENCOUNTER — Ambulatory Visit (INDEPENDENT_AMBULATORY_CARE_PROVIDER_SITE_OTHER): Payer: 59 | Admitting: Family Medicine

## 2022-05-22 VITALS — BP 128/84 | HR 93 | Ht 68.0 in | Wt 151.0 lb

## 2022-05-22 DIAGNOSIS — F1010 Alcohol abuse, uncomplicated: Secondary | ICD-10-CM

## 2022-05-22 DIAGNOSIS — I10 Essential (primary) hypertension: Secondary | ICD-10-CM | POA: Diagnosis not present

## 2022-05-22 DIAGNOSIS — F419 Anxiety disorder, unspecified: Secondary | ICD-10-CM

## 2022-05-22 MED ORDER — LISINOPRIL-HYDROCHLOROTHIAZIDE 20-25 MG PO TABS: 1.0000 | ORAL_TABLET | Freq: Every day | ORAL | 1 refills | Status: DC

## 2022-05-22 MED ORDER — AMLODIPINE BESYLATE 10 MG PO TABS: 10.0000 mg | ORAL_TABLET | Freq: Every day | ORAL | 1 refills | Status: DC

## 2022-05-22 NOTE — Progress Notes (Signed)
     Primary Care / Sports Medicine Office Visit  Patient Information:  Patient ID: Christopher Maynard, male DOB: 07-Aug-1985 Age: 37 y.o. MRN: [id number]   ARCH PLANT is a pleasant 37 y.o. male presenting with the following:  Chief Complaint  Patient presents with   Hypertension    Vitals:   05/22/22 1532  BP: 128/84  Pulse: 93  SpO2: 99%   Vitals:   05/22/22 1532  Weight: 151 lb (68.5 kg)  Height: 5\' 8"  (1.727 m)   Body mass index is 22.96 kg/m.  No results found.   Independent interpretation of notes and tests performed by another provider:   None  Procedures performed:   None  Pertinent History, Exam, Impression, and Recommendations:   Christopher Maynard was seen today for hypertension.  Alcohol abuse Assessment & Plan: Chronic, improved, has cutback significantly and noted associated improved BP readings, no withdrawal symptoms reported. Encouraged continued wean.   Primary hypertension Assessment & Plan: Excellent response to medications, tolerating without issue, will continue current regimen.  Orders: -     Lisinopril-hydroCHLOROthiazide; Take 1 tablet by mouth daily.  Dispense: 90 tablet; Refill: 1 -     amLODIPine Besylate; Take 1 tablet (10 mg total) by mouth daily.  Dispense: 90 tablet; Refill: 1  Anxiety Assessment & Plan: Did trial Zoloft to assess anxiety component of hypertension, did not tolerate well "didn't feel himself". Will remove medication and monitor.      Orders & Medications Meds ordered this encounter  Medications   lisinopril-hydrochlorothiazide (ZESTORETIC) 20-25 MG tablet    Sig: Take 1 tablet by mouth daily.    Dispense:  90 tablet    Refill:  1   amLODipine (NORVASC) 10 MG tablet    Sig: Take 1 tablet (10 mg total) by mouth daily.    Dispense:  90 tablet    Refill:  1   No orders of the defined types were placed in this encounter.    Return in about 4 weeks (around 06/19/2022) for CPE.     Jerrol Banana, MD,  [facility]   Primary Care Sports Medicine Primary Care and Sports Medicine at [facility]

## 2022-05-24 NOTE — Assessment & Plan Note (Signed)
Excellent response to medications, tolerating without issue, will continue current regimen.

## 2022-05-24 NOTE — Patient Instructions (Signed)
-   Continue current blood pressure medications - Remain off of sertraline (Zoloft) - Return in 4 weeks

## 2022-05-24 NOTE — Assessment & Plan Note (Signed)
Chronic, improved, has cutback significantly and noted associated improved BP readings, no withdrawal symptoms reported. Encouraged continued wean.

## 2022-05-24 NOTE — Assessment & Plan Note (Signed)
Did trial Zoloft to assess anxiety component of hypertension, did not tolerate well "didn't feel himself". Will remove medication and monitor.

## 2022-05-28 ENCOUNTER — Encounter: Payer: Self-pay | Admitting: Family Medicine

## 2022-06-05 ENCOUNTER — Encounter: Payer: Self-pay | Admitting: Family Medicine

## 2022-06-10 ENCOUNTER — Other Ambulatory Visit: Payer: Self-pay

## 2022-06-10 DIAGNOSIS — I10 Essential (primary) hypertension: Secondary | ICD-10-CM

## 2022-06-10 MED ORDER — LISINOPRIL-HYDROCHLOROTHIAZIDE 20-25 MG PO TABS: 1.0000 | ORAL_TABLET | Freq: Every day | ORAL | 1 refills | Status: AC

## 2022-06-10 MED ORDER — AMLODIPINE BESYLATE 10 MG PO TABS: 10.0000 mg | ORAL_TABLET | Freq: Every day | ORAL | 1 refills | Status: AC

## 2022-07-28 ENCOUNTER — Encounter: Payer: 59 | Admitting: Family Medicine

## 2022-07-31 ENCOUNTER — Encounter: Payer: Self-pay | Admitting: Family Medicine

## 2022-08-11 ENCOUNTER — Encounter: Payer: Self-pay | Admitting: Family Medicine

## 2022-09-30 IMAGING — CT CT CERVICAL SPINE W/O CM
3 of 4 series · 10 of 33 positions shown, 11 images · non-contrast
Comparison: None.

CLINICAL DATA: Trauma.



[Series 5: sagittal bone · sagittal · 0.20mm/px · 5 of 50 slices shown]
[im 17/50  bone]
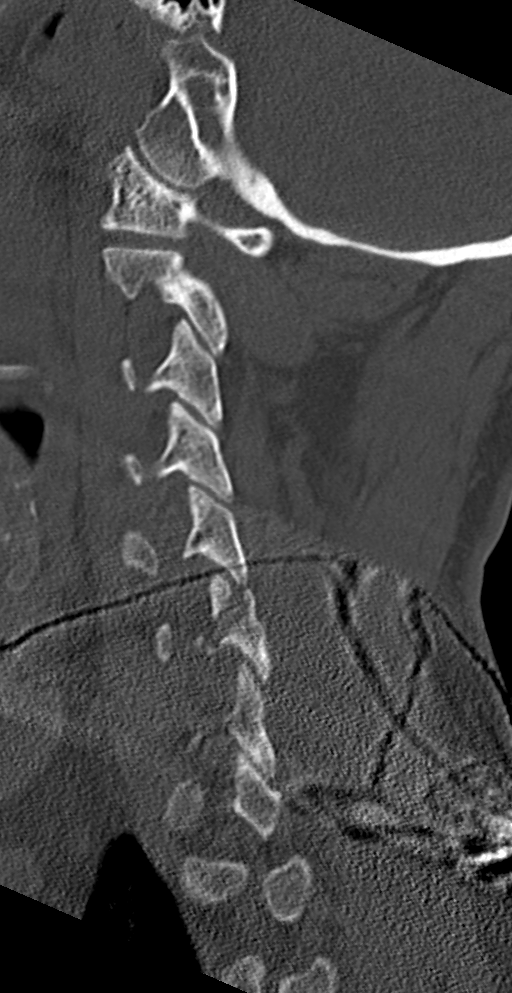
[im 21/50  bone]
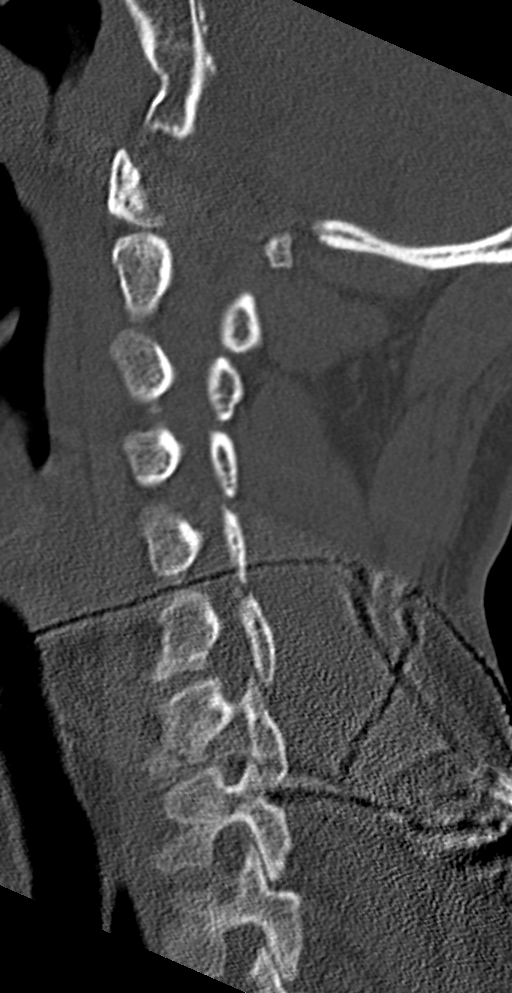
[im 25/50  bone]
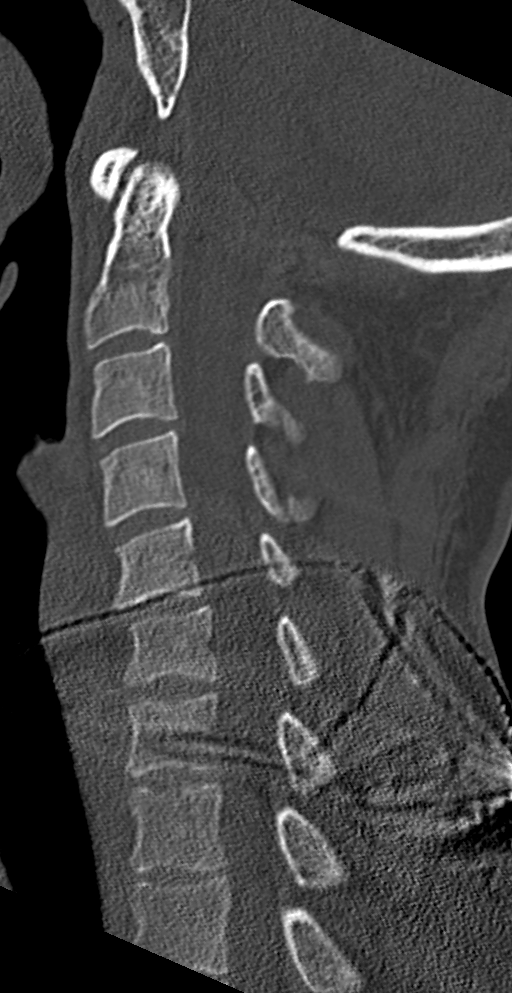
[im 29/50  bone]
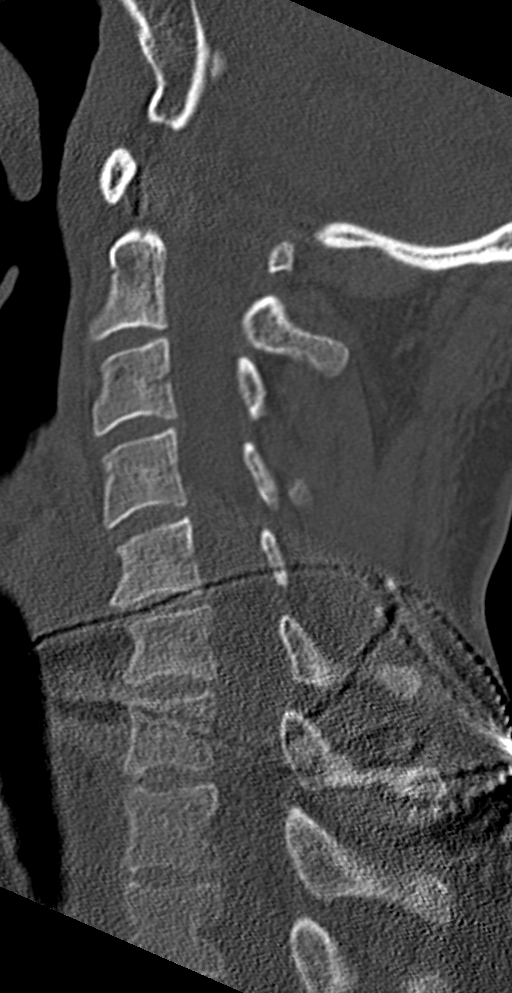
[im 33/50  bone]
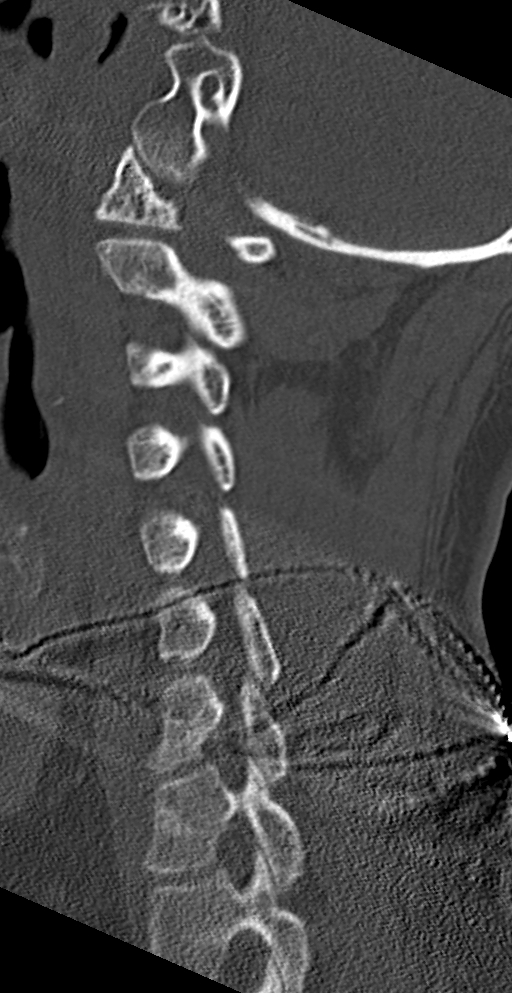

[Series 6: orthogonal bone · axial · 0.20mm/px · z∈[+1213,+1272]mm · 2 of 95 slices shown, 3 images]
[im 32/95  soft-tissue]
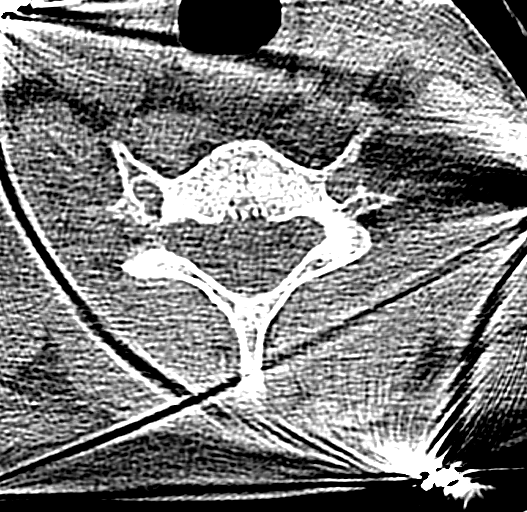
[im 32/95  bone]
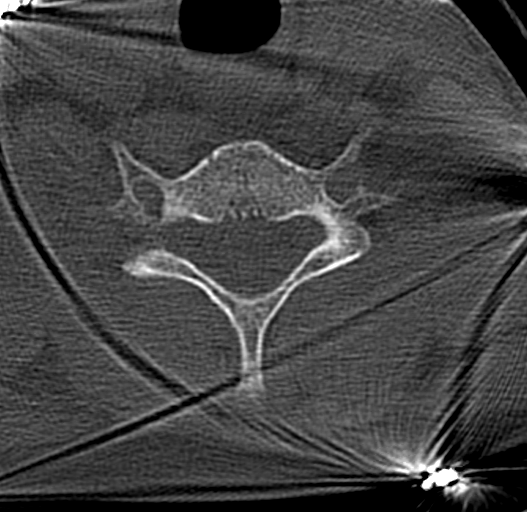
[im 63/95  bone]
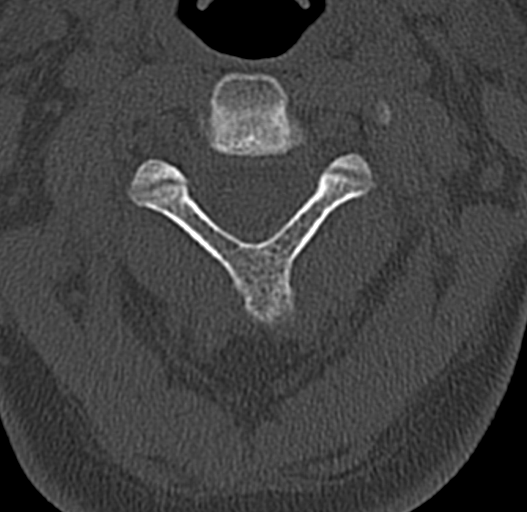

[Series 7: coronal bone · coronal · 0.21mm/px · 3 of 47 slices shown]
[im 10/47  bone]
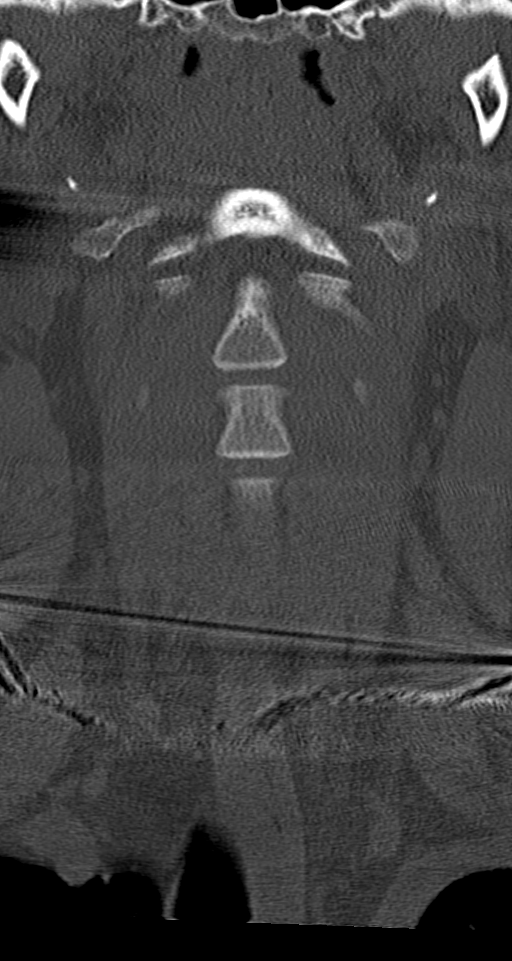
[im 19/47  bone]
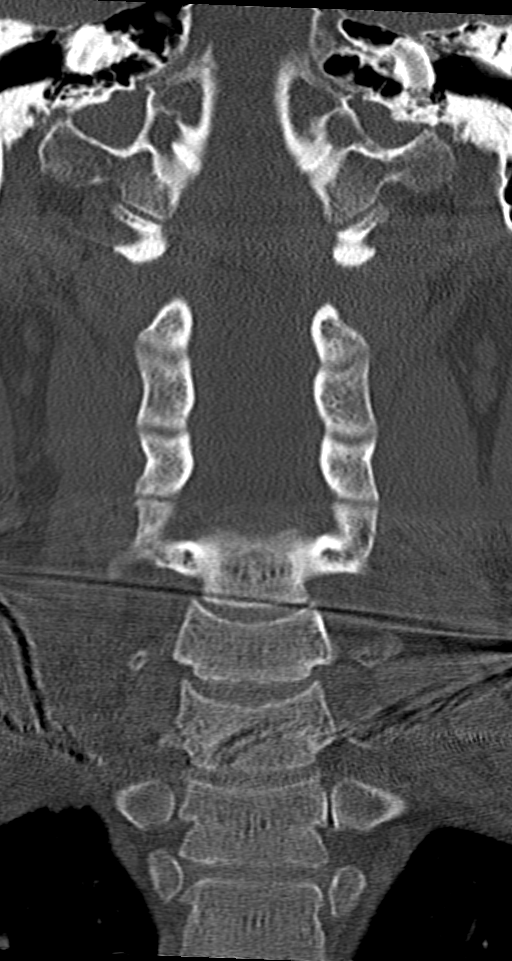
[im 28/47  bone]
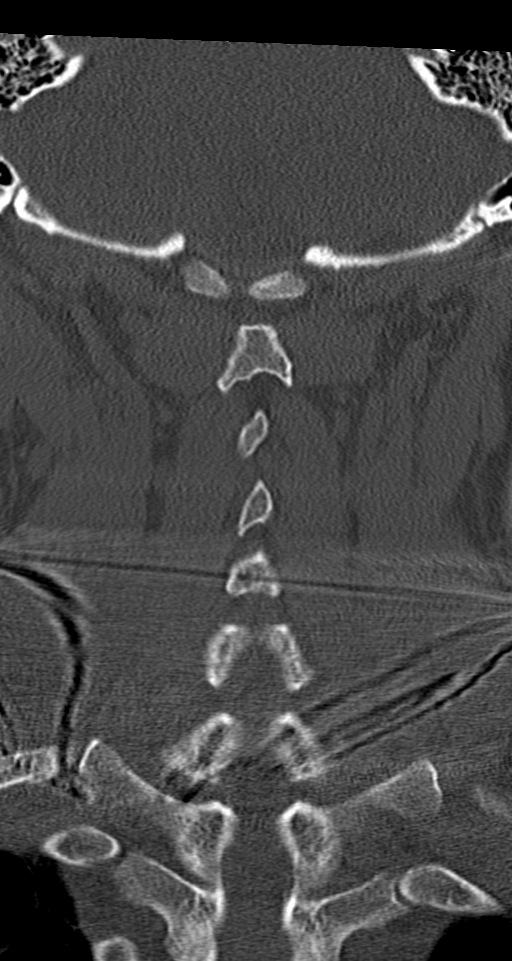

[10 of 33 positions shown; findings below may reference images not displayed]

FINDINGS: CT HEAD FINDINGS

Brain: The ventricles and sulci are appropriate size for the
patient's age. The gray-white matter discrimination is preserved.
There is no acute intracranial hemorrhage. No mass effect or midline
shift. No extra-axial fluid collection.

Vascular: No hyperdense vessel or unexpected calcification.

Skull: Normal. Negative for fracture or focal lesion.

Sinuses/Orbits: Diffuse mucoperiosteal thickening of paranasal
sinuses. No air-fluid level. The mastoid air cells are clear.

Other: None

CT CERVICAL SPINE FINDINGS

Alignment: No acute subluxation. There is straightening of normal
cervical lordosis which may be positional or due to muscle spasm.

Skull base and vertebrae: There is mildly displaced fracture of the
right superior articular process of C6 with extension of the
fracture into the articular facet. There is impaction of the right
C5 inferior articular process on the C6 articular process fracture.
There is extension of the fracture into the right C6 transverse
foramen. CT angiography is recommended to exclude traumatic injury
to the vertebral artery. No other acute fracture. There is
incomplete bony fusion of posterior ring of C1.

Soft tissues and spinal canal: No prevertebral fluid or swelling. No
visible canal hematoma.

Disc levels:  No significant degenerative changes.

Upper chest: Negative.

Other: None
IMPRESSION: 1. No acute intracranial abnormality.
2. Mildly displaced fracture of the right superior articular process
of C6 with extension of the fracture into the articular facet. There
is impaction of the right C5 inferior articular process on the C6
articular process fracture. There is extension of the fracture into
the right C6 transverse foramen. CT angiography is recommended to
exclude traumatic injury to the vertebral artery.

These results were called by telephone at the time of interpretation
on 06/13/2021 at [DATE] to provider REGINA DADA , who verbally
acknowledged these results.

## 2022-09-30 IMAGING — CT CT ANGIO NECK
2 of 7 series · 8 of 33 positions shown · IV contrast (OMNIPAQUE 350)
Comparison: None.

CLINICAL DATA: C6 fracture



[Series 5: cta neck · axial · 0.51mm/px · z∈[+1201,+1281]mm · 2 of 121 slices shown]
[im 41/121  soft-tissue]
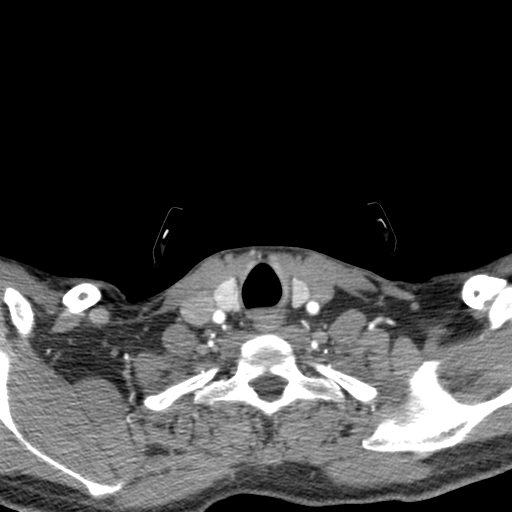
[im 81/121  soft-tissue]
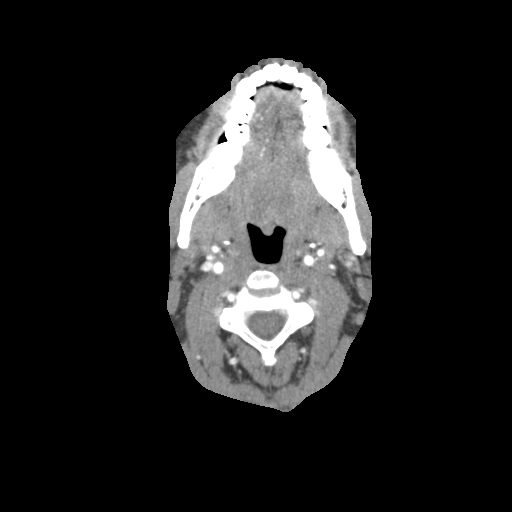

[Series 7: ax thin · axial · 0.39mm/px · z∈[+1155,+1325]mm · 6 of 240 slices shown]
[im 35/240  soft-tissue]
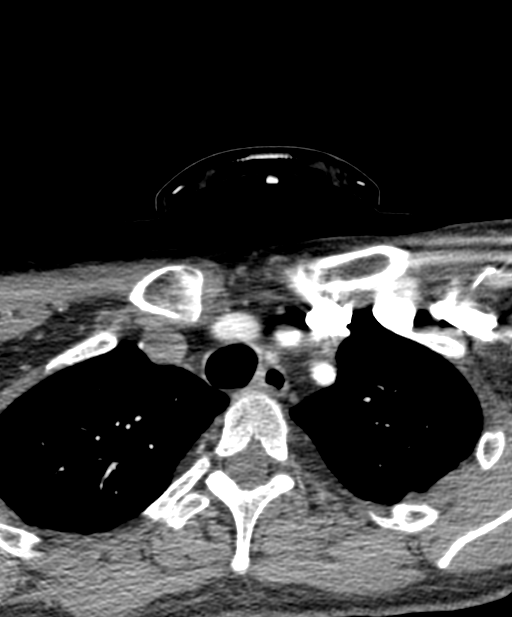
[im 69/240  bone]
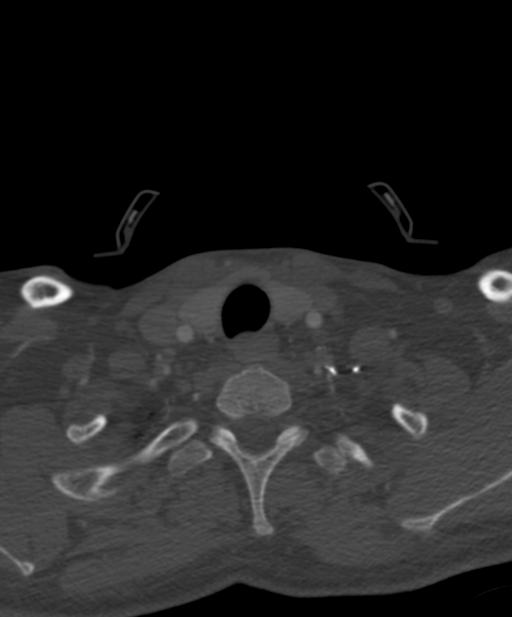
[im 103/240  soft-tissue]
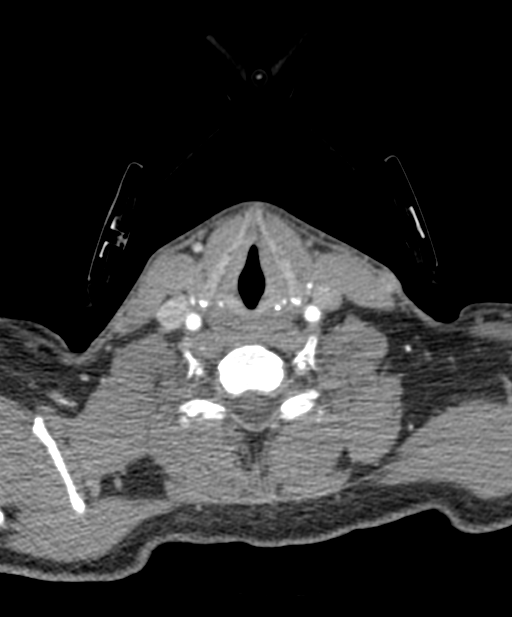
[im 137/240  bone]
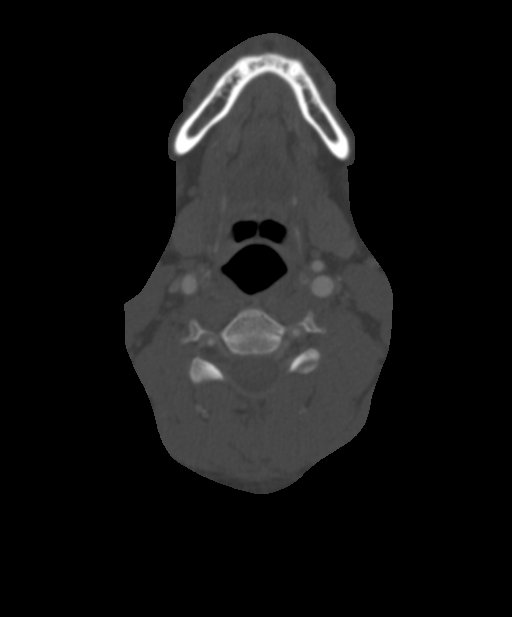
[im 171/240  soft-tissue]
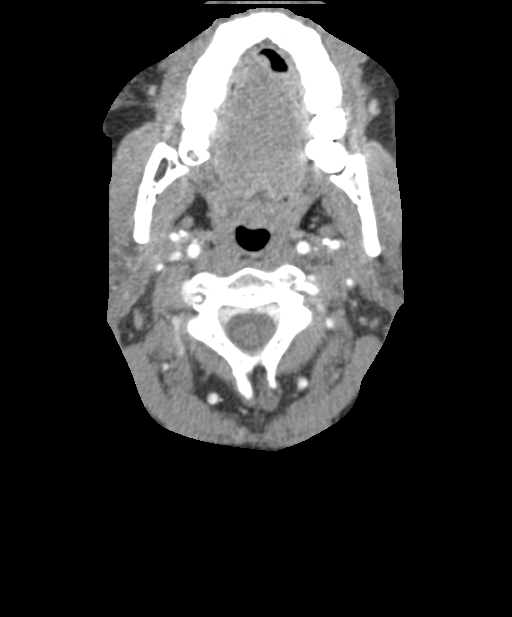
[im 205/240  bone]
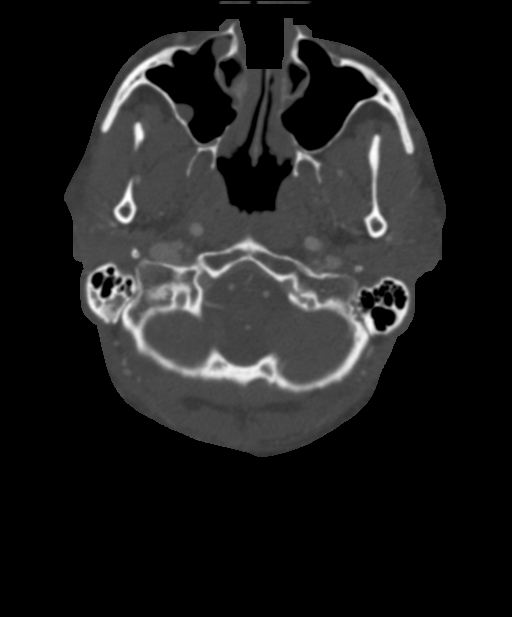

[8 of 33 positions shown; findings below may reference images not displayed]

RADIATION DOSE REDUCTION: This exam was performed according to the
departmental dose-optimization program which includes automated
exposure control, adjustment of the mA and/or kV according to
patient size and/or use of iterative reconstruction technique.

CONTRAST:  70mL OMNIPAQUE IOHEXOL 350 MG/ML SOLN
FINDINGS: Aortic arch: Standard branching. Imaged portion shows no evidence of
aneurysm or dissection. No significant stenosis of the major arch
vessel origins.

Right carotid system: No evidence of dissection, stenosis (50% or
greater) or occlusion.

Left carotid system: No evidence of dissection, stenosis (50% or
greater) or occlusion.

Vertebral arteries: There is minimal narrowing of the right
vertebral artery at the level of the C6 transverse process.
Otherwise, the vertebral arteries are normal.

Skeleton: C6 right articular pillar fracture

Other neck: Negative

Upper chest: Clear
IMPRESSION: Minimal narrowing of the right vertebral artery at the level of the
C6 transverse process fracture, may indicate grade 1 blunt
cerebrovascular injury.

## 2022-09-30 IMAGING — CT CT HEAD W/O CM
3 series · 14 of 47 positions shown, 16 images · non-contrast
Comparison: None.

CLINICAL DATA: Trauma.



[Series 3: head wo · axial · 0.47mm/px · z∈[+1339,+1479]mm · 8 of 34 slices shown, 10 images]
[im 3/34  brain]
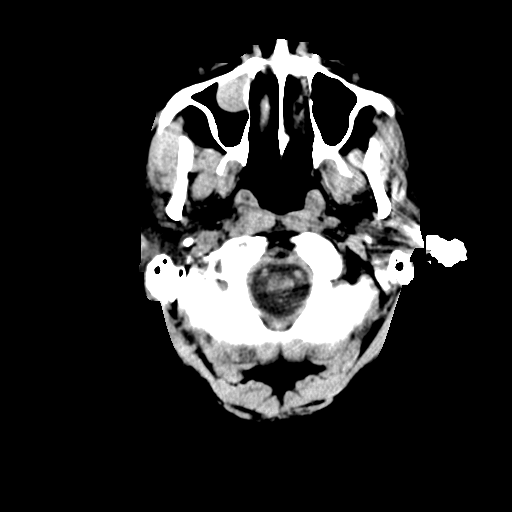
[im 3/34  bone]
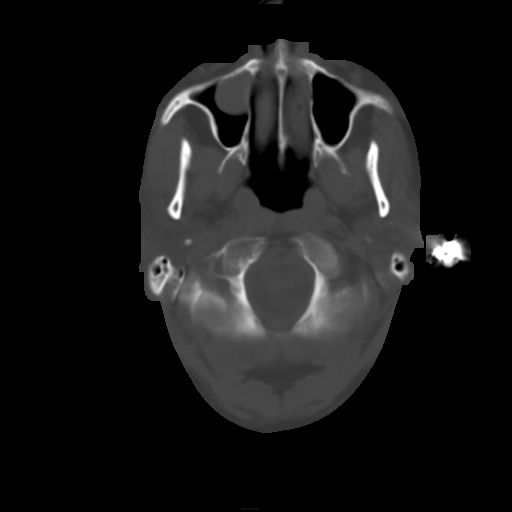
[im 7/34  brain]
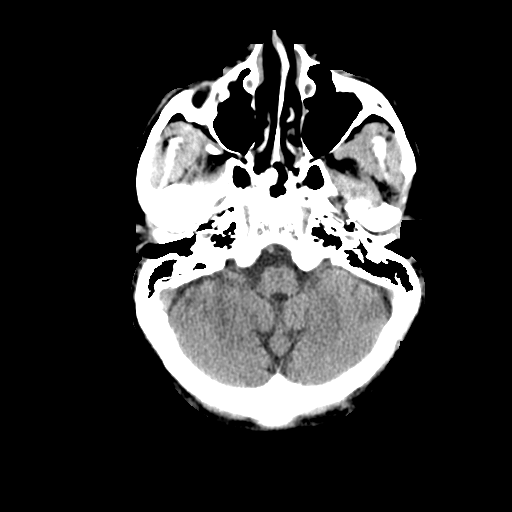
[im 11/34  brain]
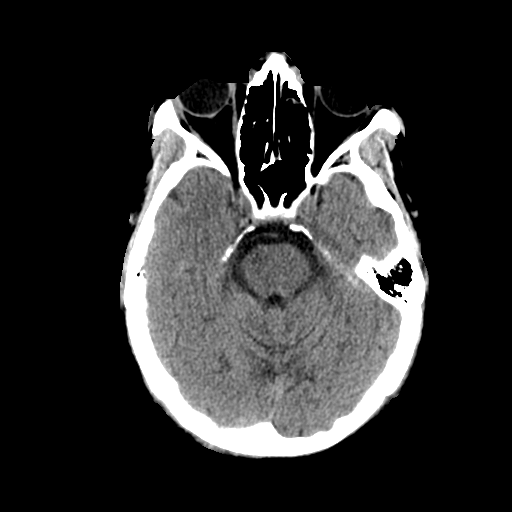
[im 15/34  brain]
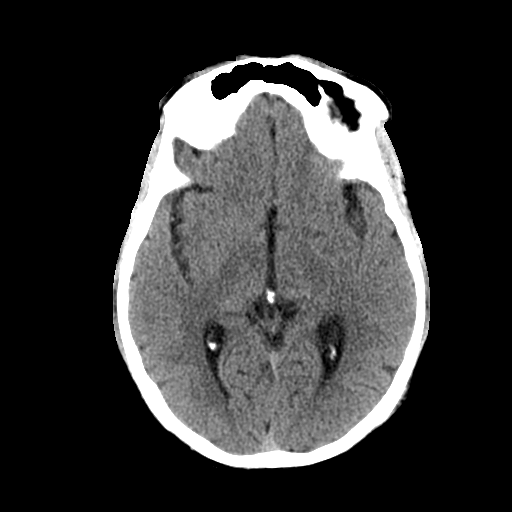
[im 19/34  brain]
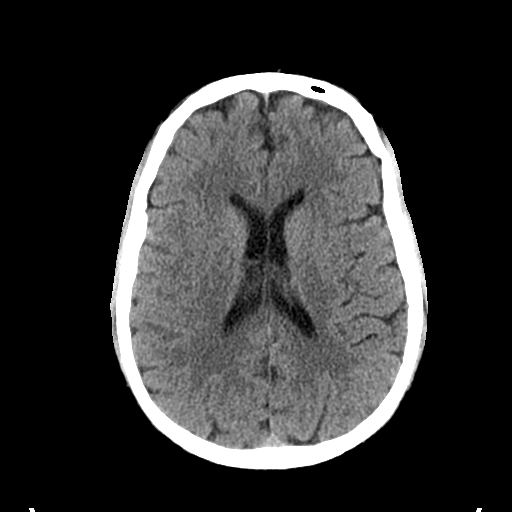
[im 19/34  bone]
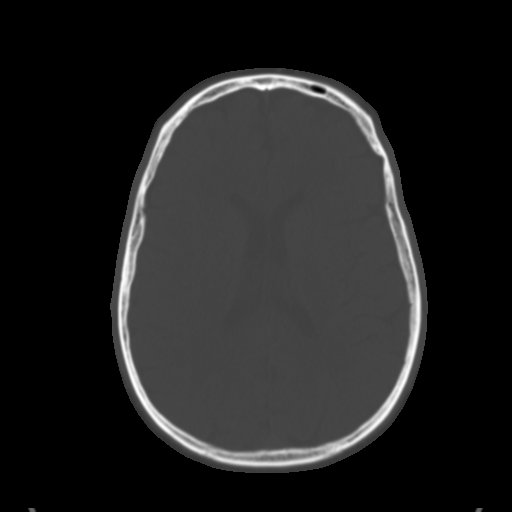
[im 23/34  brain]
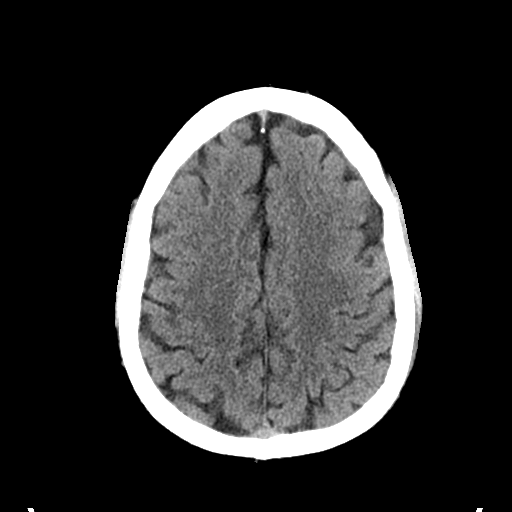
[im 27/34  brain]
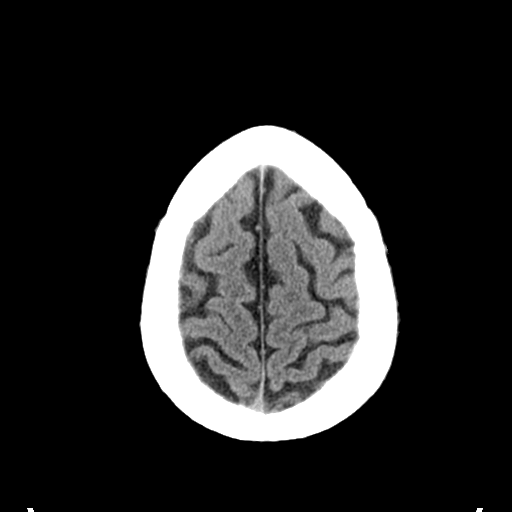
[im 31/34  brain]
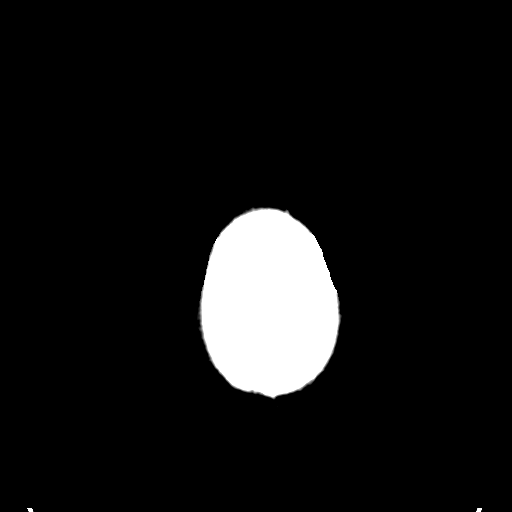

[Series 5: coronal soft tissue · coronal · 0.33mm/px · 3 of 69 slices shown]
[im 23/69  brain]
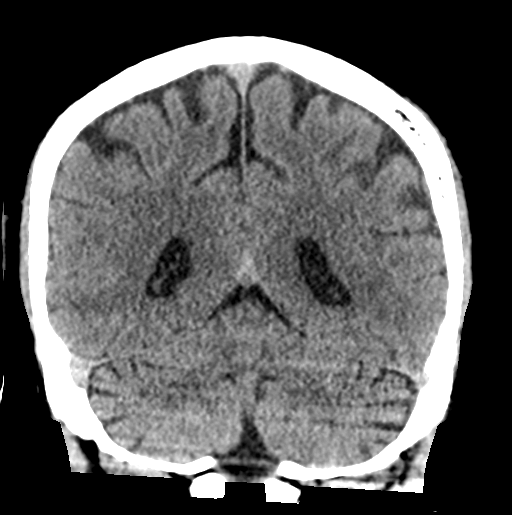
[im 31/69  brain]
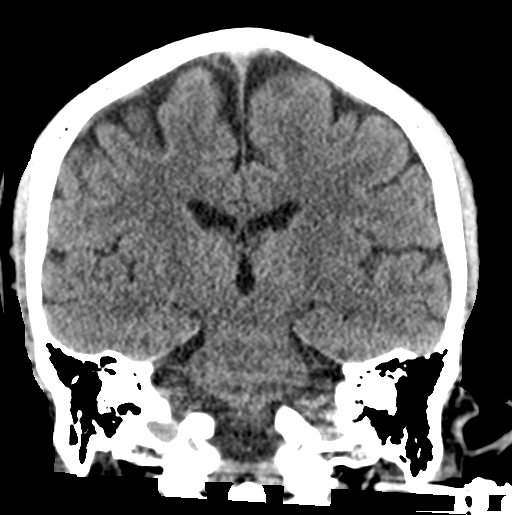
[im 38/69  brain]
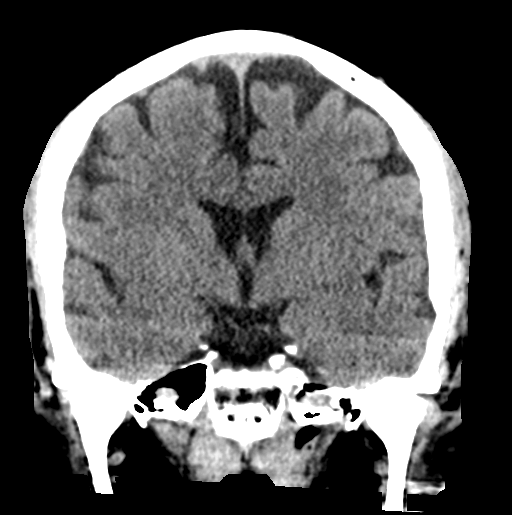

[Series 6: sagittal soft tissue · sagittal · 0.33mm/px · 3 of 53 slices shown]
[im 18/53  brain]
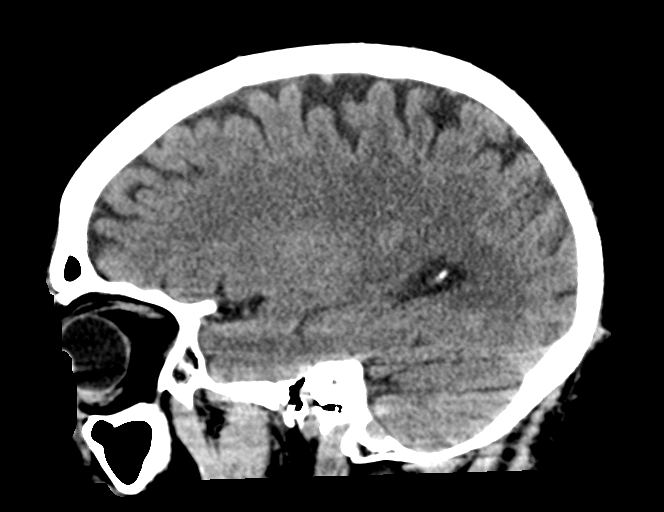
[im 27/53  brain]
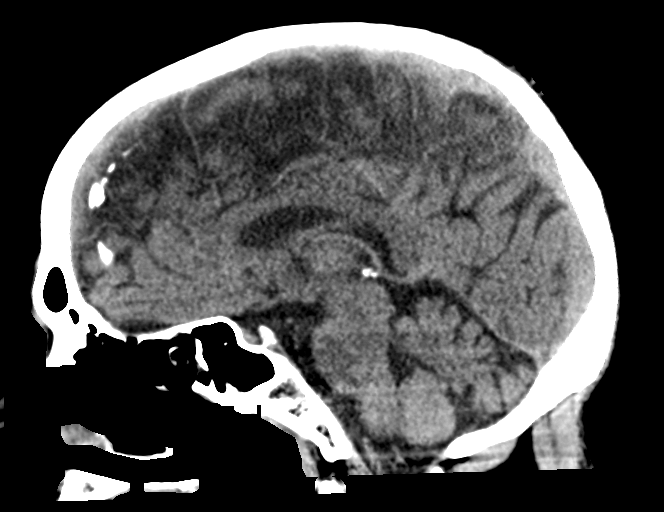
[im 35/53  brain]
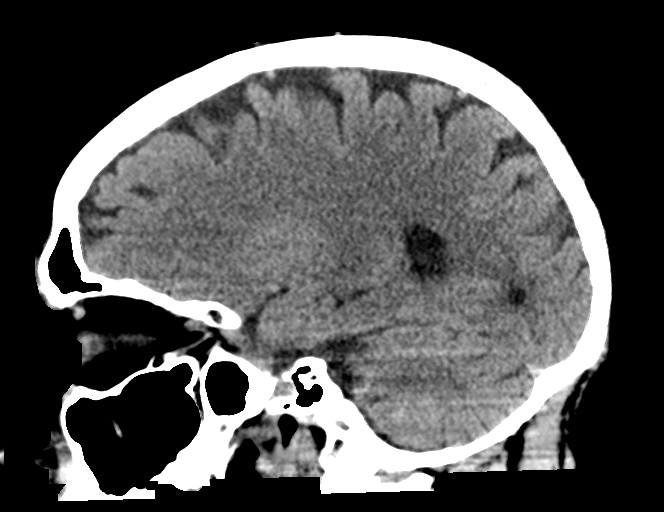

[14 of 47 positions shown; findings below may reference images not displayed]

FINDINGS: CT HEAD FINDINGS

Brain: The ventricles and sulci are appropriate size for the
patient's age. The gray-white matter discrimination is preserved.
There is no acute intracranial hemorrhage. No mass effect or midline
shift. No extra-axial fluid collection.

Vascular: No hyperdense vessel or unexpected calcification.

Skull: Normal. Negative for fracture or focal lesion.

Sinuses/Orbits: Diffuse mucoperiosteal thickening of paranasal
sinuses. No air-fluid level. The mastoid air cells are clear.

Other: None

CT CERVICAL SPINE FINDINGS

Alignment: No acute subluxation. There is straightening of normal
cervical lordosis which may be positional or due to muscle spasm.

Skull base and vertebrae: There is mildly displaced fracture of the
right superior articular process of C6 with extension of the
fracture into the articular facet. There is impaction of the right
C5 inferior articular process on the C6 articular process fracture.
There is extension of the fracture into the right C6 transverse
foramen. CT angiography is recommended to exclude traumatic injury
to the vertebral artery. No other acute fracture. There is
incomplete bony fusion of posterior ring of C1.

Soft tissues and spinal canal: No prevertebral fluid or swelling. No
visible canal hematoma.

Disc levels:  No significant degenerative changes.

Upper chest: Negative.

Other: None
IMPRESSION: 1. No acute intracranial abnormality.
2. Mildly displaced fracture of the right superior articular process
of C6 with extension of the fracture into the articular facet. There
is impaction of the right C5 inferior articular process on the C6
articular process fracture. There is extension of the fracture into
the right C6 transverse foramen. CT angiography is recommended to
exclude traumatic injury to the vertebral artery.

These results were called by telephone at the time of interpretation
on 06/13/2021 at [DATE] to provider REGINA DADA , who verbally
acknowledged these results.

## 2023-12-09 ENCOUNTER — Telehealth: Payer: Self-pay

## 2023-12-09 NOTE — Transitions of Care (Post Inpatient/ED Visit) (Signed)
   12/09/2023  Name: Christopher Maynard MRN: [id number] DOB: February 27, 1986  Today's TOC FU Call Status: Today's TOC FU Call Status:: Unsuccessful Call (1st Attempt) Unsuccessful Call (1st Attempt) Date: 12/09/23  Attempted to reach the patient regarding the most recent Inpatient/ED visit.  Follow Up Plan: Additional outreach attempts will be made to reach the patient to complete the Transitions of Care (Post Inpatient/ED visit) call.   Signature  Roetta Chard CMA
